# Patient Record
Sex: Female | Born: 1965 | Race: White | Hispanic: No | Marital: Married | Smoking: Never smoker
Health system: Southern US, Community
[De-identification: ages and names within clinical notes are randomized; demographics above are authoritative.]

## PROBLEM LIST (undated history)

## (undated) DIAGNOSIS — N200 Calculus of kidney: Secondary | ICD-10-CM

## (undated) DIAGNOSIS — M51369 Other intervertebral disc degeneration, lumbar region without mention of lumbar back pain or lower extremity pain: Secondary | ICD-10-CM

## (undated) DIAGNOSIS — M5030 Other cervical disc degeneration, unspecified cervical region: Secondary | ICD-10-CM

## (undated) DIAGNOSIS — G4489 Other headache syndrome: Secondary | ICD-10-CM

## (undated) DIAGNOSIS — E039 Hypothyroidism, unspecified: Secondary | ICD-10-CM

## (undated) DIAGNOSIS — M5136 Other intervertebral disc degeneration, lumbar region: Secondary | ICD-10-CM

## (undated) HISTORY — PX: BREAST ENHANCEMENT SURGERY: SHX7

## (undated) HISTORY — DX: Other headache syndrome: G44.89

## (undated) HISTORY — PX: NECK SURGERY: SHX720

## (undated) HISTORY — PX: BLOOD PATCH: SHX1245

## (undated) HISTORY — DX: Hypothyroidism, unspecified: E03.9

## (undated) HISTORY — PX: TUBAL LIGATION: SHX77

---

## 2001-06-21 ENCOUNTER — Emergency Department (HOSPITAL_COMMUNITY): Admission: EM | Admit: 2001-06-21 | Discharge: 2001-06-21 | Payer: Self-pay | Admitting: Internal Medicine

## 2001-06-21 ENCOUNTER — Encounter: Payer: Self-pay | Admitting: Internal Medicine

## 2001-07-02 ENCOUNTER — Ambulatory Visit (HOSPITAL_COMMUNITY): Admission: RE | Admit: 2001-07-02 | Discharge: 2001-07-02 | Payer: Self-pay | Admitting: Pulmonary Disease

## 2001-09-06 ENCOUNTER — Encounter: Admission: RE | Admit: 2001-09-06 | Discharge: 2001-09-13 | Payer: Self-pay | Admitting: Family Medicine

## 2001-09-20 ENCOUNTER — Encounter (HOSPITAL_COMMUNITY): Admission: RE | Admit: 2001-09-20 | Discharge: 2001-10-20 | Payer: Self-pay | Admitting: Pulmonary Disease

## 2001-11-08 ENCOUNTER — Encounter: Payer: Self-pay | Admitting: Neurosurgery

## 2001-11-08 ENCOUNTER — Ambulatory Visit (HOSPITAL_COMMUNITY): Admission: RE | Admit: 2001-11-08 | Discharge: 2001-11-08 | Payer: Self-pay | Admitting: Neurosurgery

## 2005-07-01 ENCOUNTER — Ambulatory Visit (HOSPITAL_COMMUNITY): Admission: RE | Admit: 2005-07-01 | Discharge: 2005-07-01 | Payer: Self-pay | Admitting: Plastic Surgery

## 2008-02-13 ENCOUNTER — Ambulatory Visit (HOSPITAL_COMMUNITY): Admission: RE | Admit: 2008-02-13 | Discharge: 2008-02-13 | Payer: Self-pay | Admitting: Pulmonary Disease

## 2014-02-24 ENCOUNTER — Ambulatory Visit (HOSPITAL_COMMUNITY)
Admission: RE | Admit: 2014-02-24 | Discharge: 2014-02-24 | Disposition: A | Discharge status: Home / Self Care | Payer: Managed Care, Other (non HMO) | Source: Ambulatory Visit | Attending: Family Medicine | Admitting: Family Medicine

## 2014-02-24 ENCOUNTER — Other Ambulatory Visit (HOSPITAL_COMMUNITY): Payer: Self-pay | Admitting: Family Medicine

## 2014-02-24 DIAGNOSIS — R1031 Right lower quadrant pain: Secondary | ICD-10-CM

## 2014-02-24 DIAGNOSIS — N200 Calculus of kidney: Secondary | ICD-10-CM | POA: Insufficient documentation

## 2014-02-24 MED ORDER — IOHEXOL 300 MG/ML  SOLN
100.0000 mL | Freq: Once | INTRAMUSCULAR | Status: AC | PRN
  Administered 2014-02-24: 100 mL via INTRAVENOUS

## 2014-02-24 MED ORDER — SODIUM CHLORIDE 0.9 % IJ SOLN
INTRAMUSCULAR | Status: AC
  Filled 2014-02-24: qty 45

## 2014-02-24 MED ORDER — SODIUM CHLORIDE 0.9 % IJ SOLN
INTRAMUSCULAR | Status: AC
  Filled 2014-02-24: qty 500

## 2014-08-25 ENCOUNTER — Ambulatory Visit (HOSPITAL_COMMUNITY)
Admission: RE | Admit: 2014-08-25 | Discharge: 2014-08-25 | Disposition: A | Discharge status: Home / Self Care | Payer: Managed Care, Other (non HMO) | Source: Ambulatory Visit | Attending: Family Medicine | Admitting: Family Medicine

## 2014-08-25 ENCOUNTER — Other Ambulatory Visit (HOSPITAL_COMMUNITY): Payer: Self-pay | Admitting: Family Medicine

## 2014-08-25 DIAGNOSIS — R079 Chest pain, unspecified: Secondary | ICD-10-CM | POA: Diagnosis not present

## 2014-08-25 DIAGNOSIS — R918 Other nonspecific abnormal finding of lung field: Secondary | ICD-10-CM | POA: Diagnosis not present

## 2014-08-25 DIAGNOSIS — J069 Acute upper respiratory infection, unspecified: Secondary | ICD-10-CM

## 2014-08-25 DIAGNOSIS — R0602 Shortness of breath: Secondary | ICD-10-CM | POA: Diagnosis not present

## 2014-08-25 DIAGNOSIS — R05 Cough: Secondary | ICD-10-CM | POA: Insufficient documentation

## 2014-09-08 ENCOUNTER — Ambulatory Visit (HOSPITAL_COMMUNITY)
Admission: RE | Admit: 2014-09-08 | Discharge: 2014-09-08 | Disposition: A | Discharge status: Home / Self Care | Payer: Managed Care, Other (non HMO) | Source: Ambulatory Visit | Attending: Family Medicine | Admitting: Family Medicine

## 2014-09-08 ENCOUNTER — Other Ambulatory Visit (HOSPITAL_COMMUNITY): Payer: Self-pay | Admitting: Family Medicine

## 2014-09-08 DIAGNOSIS — Z87891 Personal history of nicotine dependence: Secondary | ICD-10-CM | POA: Insufficient documentation

## 2014-09-08 DIAGNOSIS — R0602 Shortness of breath: Secondary | ICD-10-CM | POA: Insufficient documentation

## 2014-09-08 DIAGNOSIS — J449 Chronic obstructive pulmonary disease, unspecified: Secondary | ICD-10-CM | POA: Diagnosis not present

## 2014-09-08 DIAGNOSIS — J069 Acute upper respiratory infection, unspecified: Secondary | ICD-10-CM

## 2014-09-08 DIAGNOSIS — R05 Cough: Secondary | ICD-10-CM | POA: Insufficient documentation

## 2014-10-22 ENCOUNTER — Telehealth: Payer: Self-pay

## 2014-10-22 NOTE — Telephone Encounter (Signed)
PATIENT RECEIVED LETTER TO SCHEDULE COLONOSCOPY   PLEASE CALL AT [phone] AFTER 4

## 2014-10-29 ENCOUNTER — Telehealth: Payer: Self-pay

## 2014-10-29 NOTE — Telephone Encounter (Signed)
See note of 10/29/2014.

## 2014-10-29 NOTE — Telephone Encounter (Signed)
Pt called to schedule colonoscopy. However, she has a spot on her left buttocks and she has pressure there sometimes and the pain will shoot to the top of her head and cause very bad pain.  She also has one mixed drink or a glass of wine daily.  Scheduled for OV with Laban Emperor, NP on 11/19/2014 at 9:00 AM.

## 2014-11-03 ENCOUNTER — Ambulatory Visit (INDEPENDENT_AMBULATORY_CARE_PROVIDER_SITE_OTHER): Payer: Managed Care, Other (non HMO) | Admitting: Gastroenterology

## 2014-11-03 ENCOUNTER — Encounter: Payer: Self-pay | Admitting: Gastroenterology

## 2014-11-03 ENCOUNTER — Other Ambulatory Visit: Payer: Self-pay

## 2014-11-03 VITALS — BP 107/65 | HR 63 | Temp 97.1°F | Ht 67.0 in | Wt 174.0 lb

## 2014-11-03 DIAGNOSIS — M533 Sacrococcygeal disorders, not elsewhere classified: Secondary | ICD-10-CM

## 2014-11-03 DIAGNOSIS — Z1211 Encounter for screening for malignant neoplasm of colon: Secondary | ICD-10-CM | POA: Diagnosis not present

## 2014-11-03 MED ORDER — PEG-KCL-NACL-NASULF-NA ASC-C 100 G PO SOLR: 1.0000 | ORAL | Status: DC

## 2014-11-03 NOTE — Patient Instructions (Signed)
I have ordered an xray of your tailbone.  You have been scheduled for a colonoscopy with Dr. Oneida Alar in the near future.   Further recommendations to follow.

## 2014-11-03 NOTE — Progress Notes (Signed)
Primary Care Physician:  Jana Half Primary Gastroenterologist:  Dr. Oneida Alar   Chief Complaint  Patient presents with  . Rectal Pain  . pain in buttocks    HPI:   Anna Orr is a 49 y.o. female presenting today at the request of Delman Cheadle, PA-C. Has sharp, shooting pains at coccyx. Onset in December. Unsure what sets off the pain. States while doing sit ups would have pain therefater. Quit exercising for a few days and didn't hurt, then restarted and it did. Jan had pneumonia but still had pain. Sometimes will last a few minutes, sometimes an hour or so. Sometimes all night long. Last week started around 6pm, lasted during the night, woke up the next day with pain still. No problems since Thursday. Was so bad Thursday unable to go to work. Currently just aching. No changes in bowel habits. No constipation or diarrhea. No rectal bleeding. Hx of hemorrhoids, 3 weeks ago. Shoots pressure to top of head every time she has the pain. Feels like her ears are stopped up when it happens. Has ache in right buttock with pain. No unexplained weight loss.   Past Medical History  Diagnosis Date  . Hypothyroidism     Past Surgical History  Procedure Laterality Date  . Tubal ligation    . Breast enhancement surgery      Current Outpatient Prescriptions  Medication Sig Dispense Refill  . estrogen, conjugated,-medroxyprogesterone (PREMPRO) 0.625-2.5 MG per tablet Take 1 tablet by mouth daily.    Marland Kitchen LEVOTHYROXINE SODIUM PO Take 0.05 mg by mouth daily.     No current facility-administered medications for this visit.    Allergies as of 11/03/2014  . (No Known Allergies)    Family History  Problem Relation Age of Onset  . Colon cancer Neg Hx     History   Social History  . Marital Status: Married    Spouse Name: N/A  . Number of Children: N/A  . Years of Education: N/A   Occupational History  . printing     lifting, pushing   Social History Main Topics    . Smoking status: Never Smoker   . Smokeless tobacco: Not on file  . Alcohol Use: 0.0 oz/week    0 Standard drinks or equivalent per week     Comment: 1 mixed drink a day   . Drug Use: No  . Sexual Activity: Not on file   Other Topics Concern  . Not on file   Social History Narrative  . No narrative on file    Review of Systems: Negative unless mentioned in HPI.   Physical Exam: BP 107/65 mmHg  Pulse 63  Temp(Src) 97.1 F (36.2 C)  Ht 5\' 7"  (1.702 m)  Wt 174 lb (78.926 kg)  BMI 27.25 kg/m2 General:   Alert and oriented. Pleasant and cooperative. Well-nourished and well-developed.  Head:  Normocephalic and atraumatic. Eyes:  Without icterus, sclera clear and conjunctiva pink.  Ears:  Normal auditory acuity. Nose:  No deformity, discharge,  or lesions. Mouth:  No deformity or lesions, oral mucosa pink.  Lungs:  Clear to auscultation bilaterally. No wheezes, rales, or rhonchi. No distress.  Heart:  S1, S2 present without murmurs appreciated.  Abdomen:  +BS, soft, non-tender and non-distended. No HSM noted. No guarding or rebound. No masses appreciated.  Rectal:  Deferred  Msk:  Symmetrical without gross deformities. Normal posture. Point tenderness at coccyx Extremities:  Without clubbing or edema. Neurologic:  Alert and  oriented x4;  grossly normal neurologically. Skin:  Intact without significant lesions or rashes. Psych:  Alert and cooperative. Normal mood and affect.

## 2014-11-04 ENCOUNTER — Inpatient Hospital Stay (HOSPITAL_COMMUNITY): Admission: RE | Admit: 2014-11-04 | Payer: Managed Care, Other (non HMO) | Source: Ambulatory Visit

## 2014-11-04 ENCOUNTER — Ambulatory Visit (HOSPITAL_COMMUNITY)
Admission: RE | Admit: 2014-11-04 | Discharge: 2014-11-04 | Disposition: A | Discharge status: Home / Self Care | Payer: Managed Care, Other (non HMO) | Source: Ambulatory Visit | Attending: Gastroenterology | Admitting: Gastroenterology

## 2014-11-04 DIAGNOSIS — M533 Sacrococcygeal disorders, not elsewhere classified: Secondary | ICD-10-CM | POA: Diagnosis present

## 2014-11-07 ENCOUNTER — Encounter (HOSPITAL_COMMUNITY): Payer: Self-pay | Admitting: *Deleted

## 2014-11-07 ENCOUNTER — Encounter (HOSPITAL_COMMUNITY)
Admission: RE | Disposition: A | Discharge status: Home / Self Care | Payer: Self-pay | Source: Ambulatory Visit | Attending: Gastroenterology

## 2014-11-07 ENCOUNTER — Ambulatory Visit (HOSPITAL_COMMUNITY)
Admission: RE | Admit: 2014-11-07 | Discharge: 2014-11-07 | Disposition: A | Discharge status: Home / Self Care | Payer: Managed Care, Other (non HMO) | Source: Ambulatory Visit | Attending: Gastroenterology | Admitting: Gastroenterology

## 2014-11-07 DIAGNOSIS — Z9851 Tubal ligation status: Secondary | ICD-10-CM | POA: Diagnosis not present

## 2014-11-07 DIAGNOSIS — E039 Hypothyroidism, unspecified: Secondary | ICD-10-CM | POA: Diagnosis not present

## 2014-11-07 DIAGNOSIS — D128 Benign neoplasm of rectum: Secondary | ICD-10-CM

## 2014-11-07 DIAGNOSIS — K621 Rectal polyp: Secondary | ICD-10-CM | POA: Insufficient documentation

## 2014-11-07 DIAGNOSIS — K644 Residual hemorrhoidal skin tags: Secondary | ICD-10-CM | POA: Diagnosis not present

## 2014-11-07 DIAGNOSIS — Z79899 Other long term (current) drug therapy: Secondary | ICD-10-CM | POA: Diagnosis not present

## 2014-11-07 DIAGNOSIS — K6289 Other specified diseases of anus and rectum: Secondary | ICD-10-CM | POA: Diagnosis present

## 2014-11-07 DIAGNOSIS — Z1211 Encounter for screening for malignant neoplasm of colon: Secondary | ICD-10-CM

## 2014-11-07 HISTORY — PX: COLONOSCOPY: SHX5424

## 2014-11-07 SURGERY — COLONOSCOPY
Anesthesia: Moderate Sedation

## 2014-11-07 MED ORDER — STERILE WATER FOR IRRIGATION IR SOLN
Status: DC | PRN
  Administered 2014-11-07: 09:00:00

## 2014-11-07 MED ORDER — PROMETHAZINE HCL 25 MG/ML IJ SOLN
INTRAMUSCULAR | Status: AC
  Filled 2014-11-07: qty 1

## 2014-11-07 MED ORDER — MIDAZOLAM HCL 5 MG/5ML IJ SOLN
INTRAMUSCULAR | Status: AC
  Filled 2014-11-07: qty 10

## 2014-11-07 MED ORDER — SODIUM CHLORIDE 0.9 % IV SOLN
INTRAVENOUS | Status: DC
  Administered 2014-11-07: 08:00:00 via INTRAVENOUS

## 2014-11-07 MED ORDER — PROMETHAZINE HCL 25 MG/ML IJ SOLN
25.0000 mg | Freq: Once | INTRAMUSCULAR | Status: AC
  Administered 2014-11-07: 25 mg via INTRAVENOUS

## 2014-11-07 MED ORDER — MIDAZOLAM HCL 5 MG/5ML IJ SOLN
INTRAMUSCULAR | Status: DC | PRN
  Administered 2014-11-07: 1 mg via INTRAVENOUS
  Administered 2014-11-07 (×2): 2 mg via INTRAVENOUS
  Administered 2014-11-07: 1 mg via INTRAVENOUS

## 2014-11-07 MED ORDER — MEPERIDINE HCL 100 MG/ML IJ SOLN
INTRAMUSCULAR | Status: DC | PRN
  Administered 2014-11-07 (×2): 25 mg via INTRAVENOUS
  Administered 2014-11-07: 50 mg via INTRAVENOUS

## 2014-11-07 MED ORDER — HYDROCORTISONE ACE-PRAMOXINE 1-1 % RE CREA: TOPICAL_CREAM | RECTAL | Status: DC

## 2014-11-07 MED ORDER — SODIUM CHLORIDE 0.9 % IJ SOLN
INTRAMUSCULAR | Status: AC
  Filled 2014-11-07: qty 3

## 2014-11-07 MED ORDER — MEPERIDINE HCL 100 MG/ML IJ SOLN
INTRAMUSCULAR | Status: AC
  Filled 2014-11-07: qty 2

## 2014-11-07 NOTE — Progress Notes (Signed)
REVIEWED-NO ADDITIONAL RECOMMENDATIONS. 

## 2014-11-07 NOTE — H&P (Signed)
  Primary Care Physician:  Jana Half Primary Gastroenterologist:  Dr. Oneida Alar  Pre-Procedure History & Physical: HPI:  Anna Orr is a 49 y.o. female here for [place].  Past Medical History  Diagnosis Date  . Hypothyroidism     Past Surgical History  Procedure Laterality Date  . Tubal ligation    . Breast enhancement surgery      Prior to Admission medications   Medication Sig Start Date End Date Taking? Authorizing Provider  estrogen, conjugated,-medroxyprogesterone (PREMPRO) 0.625-2.5 MG per tablet Take 1 tablet by mouth daily.   Yes Historical Provider, MD  levothyroxine (SYNTHROID, LEVOTHROID) 50 MCG tablet Take 50 mcg by mouth daily before breakfast.   Yes Historical Provider, MD  peg 3350 powder (MOVIPREP) 100 G SOLR Take 1 kit (200 g total) by mouth as directed. 11/03/14  Yes Danie Binder, MD    Allergies as of 11/03/2014  . (No Known Allergies)    Family History  Problem Relation Age of Onset  . Colon cancer Neg Hx     History   Social History  . Marital Status: Married    Spouse Name: N/A  . Number of Children: N/A  . Years of Education: N/A   Occupational History  . printing     lifting, pushing   Social History Main Topics  . Smoking status: Never Smoker   . Smokeless tobacco: Not on file  . Alcohol Use: 0.0 oz/week    0 Standard drinks or equivalent per week     Comment: 1 mixed drink a day   . Drug Use: No  . Sexual Activity: Not on file   Other Topics Concern  . Not on file   Social History Narrative    Review of Systems: See HPI, otherwise negative ROS   Physical Exam: BP 116/63 mmHg  Pulse 65  Temp(Src) 97.6 F (36.4 C) (Oral)  Resp 20  Ht 5' 7" (1.702 m)  Wt 174 lb (78.926 kg)  BMI 27.25 kg/m2  SpO2 96% General:   Alert,  pleasant and cooperative in NAD Head:  Normocephalic and atraumatic. Neck:  Supple; Lungs:  Clear throughout to auscultation.    Heart:  Regular rate and rhythm. Abdomen:   Soft, nontender and nondistended. Normal bowel sounds, without guarding, and without rebound.   Neurologic:  Alert and  oriented x4;  grossly normal neurologically.  Impression/Plan:    SCREENING  Plan:  1. TCS TODAY

## 2014-11-07 NOTE — Discharge Instructions (Signed)
You had 2 polyps removed FROM YOUR RECTUM. You have LARGE EXTERNAL hemorrhoids.   DRINK WATER TO KEEP YOUR URINE LIGHT YELLOW.  FOLLOW A HIGH FIBER DIET. AVOID ITEMS THAT CAUSE BLOATING & GAS. SEE INFO BELOW.  USE PROCTO-CREAM FOUR TIMES A DAY FOR 10 DAYS. CALL THE OFFICE IF YOU ARE NOT BETTER IN 3 WEEKS.   YOUR BIOPSY RESULTS WILL BE AVAILABLE IN MY CHART AFTER APR 5 AND MY OFFICE WILL CONTACT YOU IN 10-14 DAYS WITH YOUR RESULTS.   FOLLOW UP IN 3 MOS.   Next colonoscopy in 5-10 years.   Colonoscopy Care After Read the instructions outlined below and refer to this sheet in the next week. These discharge instructions provide you with general information on caring for yourself after you leave the hospital. While your treatment has been planned according to the most current medical practices available, unavoidable complications occasionally occur. If you have any problems or questions after discharge, call DR. Zong Mcquarrie, [phone].  ACTIVITY  You may resume your regular activity, but move at a slower pace for the next 24 hours.   Take frequent rest periods for the next 24 hours.   Walking will help get rid of the air and reduce the bloated feeling in your belly (abdomen).   No driving for 24 hours (because of the medicine (anesthesia) used during the test).   You may shower.   Do not sign any important legal documents or operate any machinery for 24 hours (because of the anesthesia used during the test).    NUTRITION  Drink plenty of fluids.   You may resume your normal diet as instructed by your doctor.   Begin with a light meal and progress to your normal diet. Heavy or fried foods are harder to digest and may make you feel sick to your stomach (nauseated).   Avoid alcoholic beverages for 24 hours or as instructed.    MEDICATIONS  You may resume your normal medications.   WHAT YOU CAN EXPECT TODAY  Some feelings of bloating in the abdomen.   Passage of more gas  than usual.   Spotting of blood in your stool or on the toilet paper  .  IF YOU HAD POLYPS REMOVED DURING THE COLONOSCOPY:  Eat a soft diet IF YOU HAVE NAUSEA, BLOATING, ABDOMINAL PAIN, OR VOMITING.    FINDING OUT THE RESULTS OF YOUR TEST Not all test results are available during your visit. DR. Oneida Alar WILL CALL YOU WITHIN 14 DAYS OF YOUR PROCEDUE WITH YOUR RESULTS. Do not assume everything is normal if you have not heard from DR. Dayna Geurts, CALL HER OFFICE AT [phone].  SEEK IMMEDIATE MEDICAL ATTENTION AND CALL THE OFFICE: [phone] IF:  You have more than a spotting of blood in your stool.   Your belly is swollen (abdominal distention).   You are nauseated or vomiting.   You have a temperature over 101F.   You have abdominal pain or discomfort that is severe or gets worse throughout the day.   High-Fiber Diet A high-fiber diet changes your normal diet to include more whole grains, legumes, fruits, and vegetables. Changes in the diet involve replacing refined carbohydrates with unrefined foods. The calorie level of the diet is essentially unchanged. The Dietary Reference Intake (recommended amount) for adult males is 38 grams per day. For adult females, it is 25 grams per day. Pregnant and lactating women should consume 28 grams of fiber per day. Fiber is the intact part of a plant that is not  broken down during digestion. Functional fiber is fiber that has been isolated from the plant to provide a beneficial effect in the body. PURPOSE  Increase stool bulk.   Ease and regulate bowel movements.   Lower cholesterol.  INDICATIONS THAT YOU NEED MORE FIBER  Constipation and hemorrhoids.   Uncomplicated diverticulosis (intestine condition) and irritable bowel syndrome.   Weight management.   As a protective measure against hardening of the arteries (atherosclerosis), diabetes, and cancer.   GUIDELINES FOR INCREASING FIBER IN THE DIET  Start adding fiber to the diet  slowly. A gradual increase of about 5 more grams (2 slices of whole-wheat bread, 2 servings of most fruits or vegetables, or 1 bowl of high-fiber cereal) per day is best. Too rapid an increase in fiber may result in constipation, flatulence, and bloating.   Drink enough water and fluids to keep your urine clear or pale yellow. Water, juice, or caffeine-free drinks are recommended. Not drinking enough fluid may cause constipation.   Eat a variety of high-fiber foods rather than one type of fiber.   Try to increase your intake of fiber through using high-fiber foods rather than fiber pills or supplements that contain small amounts of fiber.   The goal is to change the types of food eaten. Do not supplement your present diet with high-fiber foods, but replace foods in your present diet.  INCLUDE A VARIETY OF FIBER SOURCES  Replace refined and processed grains with whole grains, canned fruits with fresh fruits, and incorporate other fiber sources. White rice, white breads, and most bakery goods contain little or no fiber.   Brown whole-grain rice, buckwheat oats, and many fruits and vegetables are all good sources of fiber. These include: broccoli, Brussels sprouts, cabbage, cauliflower, beets, sweet potatoes, white potatoes (skin on), carrots, tomatoes, eggplant, squash, berries, fresh fruits, and dried fruits.   Cereals appear to be the richest source of fiber. Cereal fiber is found in whole grains and bran. Bran is the fiber-rich outer coat of cereal grain, which is largely removed in refining. In whole-grain cereals, the bran remains. In breakfast cereals, the largest amount of fiber is found in those with "bran" in their names. The fiber content is sometimes indicated on the label.   You may need to include additional fruits and vegetables each day.   In baking, for 1 cup white flour, you may use the following substitutions:   1 cup whole-wheat flour minus 2 tablespoons.   1/2 cup white flour  plus 1/2 cup whole-wheat flour.   Polyps, Colon  A polyp is extra tissue that grows inside your body. Colon polyps grow in the large intestine. The large intestine, also called the colon, is part of your digestive system. It is a long, hollow tube at the end of your digestive tract where your body makes and stores stool. Most polyps are not dangerous. They are benign. This means they are not cancerous. But over time, some types of polyps can turn into cancer. Polyps that are smaller than a pea are usually not harmful. But larger polyps could someday become or may already be cancerous. To be safe, doctors remove all polyps and test them.   WHO GETS POLYPS? Anyone can get polyps, but certain people are more likely than others. You may have a greater chance of getting polyps if:  You are over 50.   You have had polyps before.   Someone in your family has had polyps.   Someone in your family has  had cancer of the large intestine.   Find out if someone in your family has had polyps. You may also be more likely to get polyps if you:   Eat a lot of fatty foods   Smoke   Drink alcohol   Do not exercise  Eat too much   TREATMENT  The caregiver will remove the polyp during sigmoidoscopy or colonoscopy.    PREVENTION There is not one sure way to prevent polyps. You might be able to lower your risk of getting them if you:  Eat more fruits and vegetables and less fatty food.   Do not smoke.   Avoid alcohol.   Exercise every day.   Lose weight if you are overweight.   Eating more calcium and folate can also lower your risk of getting polyps. Some foods that are rich in calcium are milk, cheese, and broccoli. Some foods that are rich in folate are chickpeas, kidney beans, and spinach.   Hemorrhoids Hemorrhoids are dilated (enlarged) veins around the rectum. Sometimes clots will form in the veins. This makes them swollen and painful. These are called thrombosed hemorrhoids. Causes of  hemorrhoids include:  Constipation.   Straining to have a bowel movement.   HEAVY LIFTING HOME CARE INSTRUCTIONS  Eat a well balanced diet and drink 6 to 8 glasses of water every day to avoid constipation. You may also use a bulk laxative.   Avoid straining to have bowel movements.   Keep anal area dry and clean.   Do not use a donut shaped pillow or sit on the toilet for long periods. This increases blood pooling and pain.   Move your bowels when your body has the urge; this will require less straining and will decrease pain and pressure.

## 2014-11-07 NOTE — Op Note (Signed)
[facility] [address] [place], [address]   COLONOSCOPY PROCEDURE REPORT  PATIENT: Anna, Orr  MR#: [id number] BIRTHDATE: [birth year] , 48  yrs. old GENDER: female ENDOSCOPIST: Danie Binder, MD REFERRED [id number] Glennon Mac, P.A. PROCEDURE DATE:  2014/11/11 PROCEDURE:   Colonoscopy with snare polypectomy INDICATIONS:average risk patient for colon cancer. pt having rectl pain cuasing pressure into her head. WORKS A PHYSICALLY DEMANDING JOB. MEDICATIONS: Demerol 100 mg IV and Versed 6 mg IV  DESCRIPTION OF PROCEDURE:    Physical exam was performed.  Informed consent was obtained from the patient after explaining the benefits, risks, and alternatives to procedure.  The patient was connected to monitor and placed in left lateral position. Continuous oxygen was provided by nasal cannula and IV medicine administered through an indwelling cannula.  After administration of sedation and rectal exam, the patients rectum was intubated and the EC-3890Li ([id number])  colonoscope was advanced under direct visualization to the ileum.  The scope was removed slowly by carefully examining the color, texture, anatomy, and integrity mucosa on the way out.  The patient was recovered in endoscopy and discharged home in satisfactory condition.    COLON FINDINGS: The examined terminal ileum appeared to be normal. , Two sessile polyps ranging from 5 to 66mm in size were found in the rectum.  A polypectomy was performed with SNARE CAUTERY  , The colon was redundant.  Manual abdominal counter-pressure was used to reach the cecum, and Moderate sized external hemorrhoids were found.  PREP QUALITY: good.  CECAL W/D TIME: 23       minutes COMPLICATIONS: None  ENDOSCOPIC IMPRESSION: 1.   RECTAL PAIN MOST LIKELY DUE TO Moderate sized external hemorrhoids 2.   Two RECTAL polyps REMOVED 3.   The LEFT colon IS redundant  RECOMMENDATIONS: DRINK WATER. FOLLOW A HIGH FIBER  DIET. MINIMIZE HEAVY LIFTING. USE HYDROCORTISONE CREAM FOUR TIMES A DAY FOR 10 DAYS.  CALL THE OFFICE IF SYMPTOMS ARE NOT BETTER IN 3 WEEKS. AWAIT BIOPSY. FOLLOW UP IN 3 MOS. Next colonoscopy in 5-10 years.  Consider overtube.    _______________________________ eSignedDanie Binder, MD 11/11/2014 7:27 PM   CPT CODES: ICD CODES:  The ICD and CPT codes recommended by this software are interpretations from the data that the clinical staff has captured with the software.  The verification of the translation of this report to the ICD and CPT codes and modifiers is the sole responsibility of the health care institution and practicing physician where this report was generated.  [employer] will not be held responsible for the validity of the ICD and CPT codes included on this report.  AMA assumes no liability for data contained or not contained herein. CPT is a Designer, television/film set of the [employer].

## 2014-11-09 DIAGNOSIS — M533 Sacrococcygeal disorders, not elsewhere classified: Secondary | ICD-10-CM | POA: Insufficient documentation

## 2014-11-09 NOTE — Assessment & Plan Note (Signed)
Due for routine screening colonoscopy now. No changes in bowel habits, rectal bleeding. Does note coccyx discomfort that seems to be correlated with movement. Will proceed with xray for further evaluation.  Proceed with colonoscopy with Dr. Oneida Alar in the near future. The risks, benefits, and alternatives have been discussed in detail with the patient. They state understanding and desire to proceed.

## 2014-11-10 ENCOUNTER — Telehealth: Payer: Self-pay | Admitting: Gastroenterology

## 2014-11-10 ENCOUNTER — Encounter (HOSPITAL_COMMUNITY): Payer: Self-pay | Admitting: Gastroenterology

## 2014-11-10 NOTE — Telephone Encounter (Signed)
I have not received anything from Dr. Oneida Alar. I just now paged her since pt's husband is waiting here for the note.  Per Marzetta Board, pt's husband is aware I have to find out from Dr. Oneida Alar so he is leaving and pt will come by on her way to work today before 4:00 pm.

## 2014-11-10 NOTE — Telephone Encounter (Signed)
I DID NOT RECEIVE A PAGE. PT NEEDS A NOTE TO SAY: PT NEEDS ASSISTANCE WITH HEAVY LIFTING.  NO  LIFTING(> 10 LBS) UNTIL APR 22.

## 2014-11-10 NOTE — Telephone Encounter (Signed)
Pt called to say that SF told her to stop by the office for a work note and she is sending her husband on his lunch break to come pick it up. Please advise if you have gotten anything from [facility] about this.

## 2014-11-10 NOTE — Progress Notes (Signed)
cc'ed to pcp °

## 2014-11-10 NOTE — Telephone Encounter (Signed)
Note done and put up front for her to pick up

## 2014-11-17 ENCOUNTER — Other Ambulatory Visit: Payer: Self-pay

## 2014-11-17 ENCOUNTER — Telehealth: Payer: Self-pay | Admitting: Gastroenterology

## 2014-11-17 MED ORDER — HYDROCORTISONE ACE-PRAMOXINE 1-1 % RE CREA: TOPICAL_CREAM | RECTAL | Status: DC

## 2014-11-17 NOTE — Telephone Encounter (Signed)
Patient said that [facility] had called in a prescription (doesn't know name of med) to [facility] and her insurance will not cover it and it'll cost her $230 to get. She said something else had been called in and it's not helping her so good and now wants to know if she can get samples of the first prescription that she doesn't remember the name of. I told her without knowing the name of the medicine that I wouldn't know if we have samples and I would have the nurse call her and see what is recommended. [phone]

## 2014-11-17 NOTE — Progress Notes (Signed)
Quick Note:  Xray normal.  Colonoscopy completed.   ______

## 2014-11-17 NOTE — Telephone Encounter (Signed)
I called pt and she said she is having a lot of rectal pain. She thinks the hemorrhoid is on a nerve. The Anal Pram 2.5 % would cost $230.00  And she did not feel she could afford that.  The hydrocortisone cream is not really helping. She is in pain and had to miss work today.  I called the pharmacist, Tammy, at [facility] and she said pt should call her insurance co and find out what is on their formulary that they will cover.  I called pt back and informed her and she said she will call them.

## 2014-11-18 NOTE — Progress Notes (Signed)
Quick Note:  Just left message for a return call in reference to meds. ______

## 2014-11-18 NOTE — Telephone Encounter (Signed)
Yes

## 2014-11-18 NOTE — Telephone Encounter (Signed)
I called pt and LMOM for a return call to see if she contacted the insurance co about another medication.

## 2014-11-18 NOTE — Progress Notes (Signed)
Quick Note:  Pt called and was informed. ______ 

## 2014-11-18 NOTE — Telephone Encounter (Signed)
Pt called and said her husband went on line and got a 75% off coupon and they got it at [facility] instead of the previous pharmacy. She only had to pay a little over $50.00.   Pt did say that since she missed work yesterday, she will get a point against her. She is checking into FMLA and would like to know if we can do paperwork if she checks on it, so if she has a flare, she will not get penalized.   Please advise!

## 2014-11-18 NOTE — Telephone Encounter (Signed)
We could always try the [facility] cream. That is usually 50$ even without insurance I believe. If she would like that, please call it in to [facility] to apply BID for 2 weeks. Dispense 1 tube with 1 refill.

## 2014-11-19 ENCOUNTER — Ambulatory Visit: Payer: Managed Care, Other (non HMO) | Admitting: Gastroenterology

## 2014-11-19 NOTE — Telephone Encounter (Signed)
LMOM that Vicente Males Will do FMLA if needed.

## 2014-11-21 ENCOUNTER — Telehealth: Payer: Self-pay | Admitting: Gastroenterology

## 2014-11-21 NOTE — Telephone Encounter (Signed)
Please call pt. She had TWO simple adenomas removed.   DRINK WATER TO KEEP YOUR URINE LIGHT YELLOW.  FOLLOW A HIGH FIBER DIET. AVOID ITEMS THAT CAUSE BLOATING & GAS.   FOLLOW UP IN 3 MOS E30 RECTAL PAIN/HEMORRHOIDS.   Next colonoscopy in 5 years WITH AN OVERTUBE.

## 2014-11-21 NOTE — Telephone Encounter (Signed)
Called pt and LMOM.  

## 2014-11-24 NOTE — Telephone Encounter (Signed)
Pt called and was informed of results.   She said she has a note from Dr. Oneida Alar about her lifting on job til 11/28/2014. She wants to know if she is still having problems if she will extend that time. She said she is getting help on the job now, but when the note expires, she is afraid it will be the same way. When she tries to lift she still has a lot of pain. She wants to know if she needs to come in earlier than the 3 months to get the note renewed.  She does not have bleeding from the hemorrhoid, just the pain with lifting.  Please advise!

## 2014-11-24 NOTE — Telephone Encounter (Signed)
OV made and NICed for tcs in 5 years

## 2014-11-24 NOTE — Telephone Encounter (Signed)
PLEASE CALL PT. I CAN EXTEND THE NOTE (UNTIL MAY13)FOR AN ADDITIONAL 3 WEEKS. SE DOES NOT NEED AN APPT.

## 2014-11-24 NOTE — Telephone Encounter (Signed)
LMOM for the pt and told her I am mailed the note to her.

## 2014-11-24 NOTE — Telephone Encounter (Signed)
Letter mailed to pt to call for results.  

## 2015-01-08 ENCOUNTER — Other Ambulatory Visit: Payer: Self-pay

## 2015-01-08 ENCOUNTER — Telehealth: Payer: Self-pay

## 2015-01-08 DIAGNOSIS — K649 Unspecified hemorrhoids: Secondary | ICD-10-CM

## 2015-01-08 MED ORDER — HYDROCORTISONE ACE-PRAMOXINE 1-1 % RE CREA: TOPICAL_CREAM | RECTAL | Status: DC

## 2015-01-08 MED ORDER — LIDOCAINE-HYDROCORTISONE ACE 3-2.5 % RE KIT: PACK | RECTAL | Status: DC

## 2015-01-08 NOTE — Telephone Encounter (Signed)
Pt aware.

## 2015-01-08 NOTE — Addendum Note (Signed)
Addended by: Danie Binder on: 01/08/2015 01:44 PM   Modules accepted: Orders, Medications

## 2015-01-08 NOTE — Telephone Encounter (Signed)
Pt is aware. States she is willing to try cream before going to a surgeon right now.

## 2015-01-08 NOTE — Telephone Encounter (Signed)
PLEASE CALL PT. SHE MAY HAVE ANOTHER ROUND OF RECTAL CREAM FOR HER RECTAL PAIN. I SENT HER RX FOR [facility] FOR THEM TO COMPOUND A HEMORRHOID CREAM THAT IS BETTER AT RELIEVING RECTAL PAIN THAN PLAIN HYDROCORTISONE CREAM.   IF SHE IS HAVING THAT MUCH DIFFICULTY WITH RECTAL PAIN, SHE NEEDS TO SEE A SURGEON FOR SURGICAL MANAGEMENT OF HER HEMORRHOIDS. SHE SHOULD USE IBUPROFEN WITH FOOD OR MILK AND TYLENOL AS NEEDED FOR ADDITIONAL PAIN RELIEF.

## 2015-01-08 NOTE — Telephone Encounter (Signed)
PLEASE CALL PT. Rx sent for Procto-cream to [facility].

## 2015-01-08 NOTE — Telephone Encounter (Addendum)
Pt called back and states that the cream is going to be over $300.00 and she can't pay that. Wants to proceed with surgical referral.  Wants to know if there is any other ointment that may be cheaper. Sent referral to Dr. Arnoldo Morale

## 2015-01-08 NOTE — Telephone Encounter (Signed)
Pt called and states that she had a TCS in April. States that she is having rectal pain and flare ups.  States she only has 800mg  of Iburprofen and is wanting to know if she can have some pain medications

## 2015-02-06 ENCOUNTER — Ambulatory Visit: Payer: Managed Care, Other (non HMO) | Admitting: Gastroenterology

## 2015-02-25 ENCOUNTER — Other Ambulatory Visit (HOSPITAL_COMMUNITY): Payer: Self-pay | Admitting: Family Medicine

## 2015-02-25 DIAGNOSIS — M545 Low back pain: Secondary | ICD-10-CM

## 2015-02-25 DIAGNOSIS — M5410 Radiculopathy, site unspecified: Secondary | ICD-10-CM

## 2015-03-05 ENCOUNTER — Ambulatory Visit (HOSPITAL_COMMUNITY)
Admission: RE | Admit: 2015-03-05 | Discharge: 2015-03-05 | Disposition: A | Discharge status: Home / Self Care | Payer: Managed Care, Other (non HMO) | Source: Ambulatory Visit | Attending: Family Medicine | Admitting: Family Medicine

## 2015-03-05 DIAGNOSIS — G9619 Other disorders of meninges, not elsewhere classified: Secondary | ICD-10-CM | POA: Insufficient documentation

## 2015-03-05 DIAGNOSIS — M5410 Radiculopathy, site unspecified: Secondary | ICD-10-CM

## 2015-03-05 DIAGNOSIS — M5136 Other intervertebral disc degeneration, lumbar region: Secondary | ICD-10-CM | POA: Insufficient documentation

## 2015-03-05 DIAGNOSIS — M545 Low back pain: Secondary | ICD-10-CM

## 2015-03-05 DIAGNOSIS — M533 Sacrococcygeal disorders, not elsewhere classified: Secondary | ICD-10-CM | POA: Insufficient documentation

## 2015-03-27 ENCOUNTER — Other Ambulatory Visit (HOSPITAL_COMMUNITY): Payer: Self-pay | Admitting: Neurosurgery

## 2015-03-27 DIAGNOSIS — G960 Cerebrospinal fluid leak: Principal | ICD-10-CM

## 2015-03-27 DIAGNOSIS — G9601 Cranial cerebrospinal fluid leak, spontaneous: Secondary | ICD-10-CM

## 2015-04-02 ENCOUNTER — Other Ambulatory Visit: Payer: Self-pay | Admitting: Neurosurgery

## 2015-04-02 DIAGNOSIS — R51 Headache with orthostatic component, not elsewhere classified: Secondary | ICD-10-CM

## 2015-04-03 ENCOUNTER — Ambulatory Visit
Admission: RE | Admit: 2015-04-03 | Discharge: 2015-04-03 | Disposition: A | Discharge status: Home / Self Care | Payer: Managed Care, Other (non HMO) | Source: Ambulatory Visit | Attending: Neurosurgery | Admitting: Neurosurgery

## 2015-04-03 DIAGNOSIS — R51 Headache with orthostatic component, not elsewhere classified: Secondary | ICD-10-CM

## 2015-04-03 MED ORDER — IOHEXOL 180 MG/ML  SOLN
1.0000 mL | Freq: Once | INTRAMUSCULAR | Status: DC | PRN
  Administered 2015-04-03: 1 mL via EPIDURAL

## 2015-04-03 NOTE — Discharge Instructions (Signed)

## 2015-04-03 NOTE — Progress Notes (Signed)
20 cc's of blood drawn from right AC. Site is unremarkable and pt tolerated procedure well.

## 2015-04-06 ENCOUNTER — Ambulatory Visit (HOSPITAL_COMMUNITY): Payer: Managed Care, Other (non HMO)

## 2015-04-07 ENCOUNTER — Encounter: Payer: Self-pay | Admitting: Gastroenterology

## 2015-04-07 ENCOUNTER — Other Ambulatory Visit: Payer: Self-pay | Admitting: Neurosurgery

## 2015-04-07 ENCOUNTER — Telehealth: Payer: Self-pay

## 2015-04-07 DIAGNOSIS — G9600 Cerebrospinal fluid leak, unspecified: Secondary | ICD-10-CM

## 2015-04-07 NOTE — Telephone Encounter (Signed)
Patient's husband left [place] a message at 0711 this morning.  I returned call to patient this morning regarding the return of her positional headache along with a back ache.  Apparently, per her husband's message, she was fine after the Epidural Blood Patch we performed Friday, 04/03/15 through Sunday, 04/05/15.  Her symptoms began to return yesterday, 04/06/15, and the husband wanted to know about his wife having another EBP.  I spoke with the patient a moment ago and told her she needed to follow up with Dr. Annette Stable to discuss her symptome and further treatment options.  She stated an understanding of this.  Brita Romp, RN

## 2015-04-07 NOTE — Telephone Encounter (Signed)
Anna Orr, I called the pt. She said her husband was making the contact for her and he e mailed the wrong office. They were trying to reach a Dr. Annette Stable about her spine. They will contact him.

## 2015-04-09 ENCOUNTER — Ambulatory Visit
Admission: RE | Admit: 2015-04-09 | Discharge: 2015-04-09 | Disposition: A | Discharge status: Home / Self Care | Payer: Managed Care, Other (non HMO) | Source: Ambulatory Visit | Attending: Neurosurgery | Admitting: Neurosurgery

## 2015-04-09 DIAGNOSIS — G9600 Cerebrospinal fluid leak, unspecified: Secondary | ICD-10-CM

## 2015-04-09 MED ORDER — IOHEXOL 180 MG/ML  SOLN
1.0000 mL | Freq: Once | INTRAMUSCULAR | Status: DC | PRN
  Administered 2015-04-09: 1 mL via EPIDURAL

## 2015-04-09 NOTE — Discharge Instructions (Signed)

## 2015-04-09 NOTE — Progress Notes (Signed)
20cc blood drawn from left AC space for Blood Patch; site unremarkable.

## 2015-04-16 NOTE — Progress Notes (Signed)
Licet called to report a return of her symptoms (low back pain with a positional headache) last night at work after having two Epidural Blood Patches here (04/03/15 with Dr. Barbie Banner and 04/09/15 with Dr. Jobe Igo).  I assured her she did nothing wrong, as she was concerned about this.  She has a call in to Dr. Annette Stable, as well, and she and I discussed Dr. Nori Riis suggestion of performing a myelogram to identify exactly from where her spinal fluid was leaking so that appropriate treatment could be given.  Aqueelah will ask Dr. Annette Stable to order this myelogram.  She also mentioned Dr. Jobe Igo saying he knows a physician at [facility] who uses glue to seal off the type of leak he suspects she has, though the myelogram would need to be done prior to treatment.  Adrienne states an understanding of all this and wishes to proceed with further diagnostic studies ASAP in order to finally treat and stop these "awful headaches I keep getting.

## 2015-04-29 ENCOUNTER — Other Ambulatory Visit: Payer: Self-pay | Admitting: Neurosurgery

## 2015-04-29 DIAGNOSIS — G9600 Cerebrospinal fluid leak, unspecified: Secondary | ICD-10-CM

## 2015-04-30 ENCOUNTER — Telehealth: Payer: Self-pay | Admitting: Gastroenterology

## 2015-04-30 NOTE — Telephone Encounter (Signed)
FMLA papers on this patient is in [facility] office chair clipped to red folder

## 2015-05-06 ENCOUNTER — Ambulatory Visit
Admission: RE | Admit: 2015-05-06 | Discharge: 2015-05-06 | Disposition: A | Discharge status: Home / Self Care | Payer: 59 | Source: Ambulatory Visit | Attending: Neurosurgery | Admitting: Neurosurgery

## 2015-05-06 DIAGNOSIS — G9600 Cerebrospinal fluid leak, unspecified: Secondary | ICD-10-CM

## 2015-05-06 MED ORDER — ONDANSETRON HCL 4 MG/2ML IJ SOLN: 4.0000 mg | Freq: Four times a day (QID) | INTRAMUSCULAR | Status: DC | PRN

## 2015-05-06 MED ORDER — IOHEXOL 180 MG/ML  SOLN
15.0000 mL | Freq: Once | INTRAMUSCULAR | Status: DC | PRN
  Administered 2015-05-06: 15 mL via INTRATHECAL

## 2015-05-06 MED ORDER — DIAZEPAM 5 MG PO TABS
10.0000 mg | ORAL_TABLET | Freq: Once | ORAL | Status: AC
  Administered 2015-05-06: 5 mg via ORAL

## 2015-05-06 NOTE — Discharge Instructions (Signed)

## 2015-05-07 NOTE — Telephone Encounter (Signed)
PLEASE CALL PT. HER FMLA PAPERWORK IS COMPLETE. SHE NEEDS TO SEE A SURGEON TO HAVE HER HEMORRHOIDS FIXED.

## 2015-05-07 NOTE — Telephone Encounter (Signed)
Pt said to disregard the FMLA papers, they should have went to another physician. She said she does not need to see anyone for hemorrhoids, that she has no hemorrhoids.

## 2015-05-07 NOTE — Telephone Encounter (Signed)
SPOKE TO PATIENT AND SHE STATED THAT SHE HAD THE FMLA PAPERS SENT HERE BY [employer] AND THEY WERE MEANT TO GO TO ANOTHER PHYSICIAN.  TO DISREGARD THEM

## 2015-05-08 NOTE — Telephone Encounter (Signed)
REVIEWED-NO ADDITIONAL RECOMMENDATIONS. 

## 2015-05-12 ENCOUNTER — Other Ambulatory Visit: Payer: Self-pay | Admitting: Neurosurgery

## 2015-05-12 DIAGNOSIS — G971 Other reaction to spinal and lumbar puncture: Secondary | ICD-10-CM

## 2015-05-13 ENCOUNTER — Ambulatory Visit
Admission: RE | Admit: 2015-05-13 | Discharge: 2015-05-13 | Disposition: A | Discharge status: Home / Self Care | Payer: 59 | Source: Ambulatory Visit | Attending: Neurosurgery | Admitting: Neurosurgery

## 2015-05-13 ENCOUNTER — Telehealth: Payer: Self-pay | Admitting: Radiology

## 2015-05-13 DIAGNOSIS — G971 Other reaction to spinal and lumbar puncture: Secondary | ICD-10-CM

## 2015-05-13 MED ORDER — DIAZEPAM 5 MG PO TABS
5.0000 mg | ORAL_TABLET | Freq: Once | ORAL | Status: AC
  Administered 2015-05-13: 5 mg via ORAL

## 2015-05-13 MED ORDER — IOHEXOL 180 MG/ML  SOLN
1.0000 mL | Freq: Once | INTRAMUSCULAR | Status: DC | PRN
  Administered 2015-05-13: 1 mL via EPIDURAL

## 2015-05-13 NOTE — Discharge Instructions (Signed)

## 2015-05-13 NOTE — Telephone Encounter (Signed)
Pt had a blood patch this am. Her husband is calling because she has fullness in her head and her buttocks is hurting enough to make her cry. Explained these were all side effects of the blood patch and that they would go away in time. Also explained pt could take her pain meds or OTC pain meds for the discomfort and use ice to her back at the injection site. These are the same c/o pt had after her first blood patch several weeks ago.

## 2015-05-13 NOTE — Progress Notes (Signed)
Approximately 20cc blood drawn from right [facility] space for Epidural Blood Patch; site unremarkable.

## 2015-05-24 ENCOUNTER — Encounter (HOSPITAL_COMMUNITY): Payer: Self-pay | Admitting: Emergency Medicine

## 2015-05-24 ENCOUNTER — Emergency Department (HOSPITAL_COMMUNITY)
Admission: EM | Admit: 2015-05-24 | Discharge: 2015-05-24 | Disposition: A | Discharge status: Home / Self Care | Payer: Managed Care, Other (non HMO) | Attending: Emergency Medicine | Admitting: Emergency Medicine

## 2015-05-24 ENCOUNTER — Emergency Department (HOSPITAL_COMMUNITY): Payer: Managed Care, Other (non HMO)

## 2015-05-24 DIAGNOSIS — N23 Unspecified renal colic: Secondary | ICD-10-CM | POA: Diagnosis not present

## 2015-05-24 DIAGNOSIS — E039 Hypothyroidism, unspecified: Secondary | ICD-10-CM | POA: Diagnosis not present

## 2015-05-24 DIAGNOSIS — Z79899 Other long term (current) drug therapy: Secondary | ICD-10-CM | POA: Diagnosis not present

## 2015-05-24 DIAGNOSIS — R109 Unspecified abdominal pain: Secondary | ICD-10-CM

## 2015-05-24 DIAGNOSIS — Z8739 Personal history of other diseases of the musculoskeletal system and connective tissue: Secondary | ICD-10-CM | POA: Diagnosis not present

## 2015-05-24 DIAGNOSIS — N201 Calculus of ureter: Secondary | ICD-10-CM | POA: Diagnosis not present

## 2015-05-24 DIAGNOSIS — Z9851 Tubal ligation status: Secondary | ICD-10-CM | POA: Diagnosis not present

## 2015-05-24 HISTORY — DX: Calculus of kidney: N20.0

## 2015-05-24 HISTORY — DX: Other cervical disc degeneration, unspecified cervical region: M50.30

## 2015-05-24 HISTORY — DX: Other intervertebral disc degeneration, lumbar region without mention of lumbar back pain or lower extremity pain: M51.369

## 2015-05-24 HISTORY — DX: Other intervertebral disc degeneration, lumbar region: M51.36

## 2015-05-24 LAB — COMPREHENSIVE METABOLIC PANEL
ALT: 12 U/L — AB (ref 14–54)
AST: 13 U/L — AB (ref 15–41)
Albumin: 3.9 g/dL (ref 3.5–5.0)
Alkaline Phosphatase: 66 U/L (ref 38–126)
Anion gap: 7 (ref 5–15)
BUN: 17 mg/dL (ref 6–20)
CHLORIDE: 106 mmol/L (ref 101–111)
CO2: 26 mmol/L (ref 22–32)
CREATININE: 0.85 mg/dL (ref 0.44–1.00)
Calcium: 8.9 mg/dL (ref 8.9–10.3)
GFR calc Af Amer: >60 mL/min (ref >60)
Glucose, Bld: 120 mg/dL — ABNORMAL HIGH (ref 65–99)
POTASSIUM: 3.5 mmol/L (ref 3.5–5.1)
SODIUM: 139 mmol/L (ref 135–145)
Total Bilirubin: 0.6 mg/dL (ref 0.3–1.2)
Total Protein: 6.8 g/dL (ref 6.5–8.1)

## 2015-05-24 LAB — CBC WITH DIFFERENTIAL/PLATELET
BASOS ABS: 0 10*3/uL (ref 0.0–0.1)
Basophils Relative: 0 %
Eosinophils Absolute: 0.2 10*3/uL (ref 0.0–0.7)
Eosinophils Relative: 2 %
HEMATOCRIT: 38.5 % (ref 36.0–46.0)
Hemoglobin: 12.9 g/dL (ref 12.0–15.0)
LYMPHS ABS: 1.8 10*3/uL (ref 0.7–4.0)
LYMPHS PCT: 23 %
MCH: 30.6 pg (ref 26.0–34.0)
MCHC: 33.5 g/dL (ref 30.0–36.0)
MCV: 91.2 fL (ref 78.0–100.0)
MONO ABS: 0.5 10*3/uL (ref 0.1–1.0)
Monocytes Relative: 6 %
NEUTROS ABS: 5.4 10*3/uL (ref 1.7–7.7)
Neutrophils Relative %: 69 %
Platelets: 268 10*3/uL (ref 150–400)
RBC: 4.22 MIL/uL (ref 3.87–5.11)
RDW: 12.5 % (ref 11.5–15.5)
WBC: 7.9 10*3/uL (ref 4.0–10.5)

## 2015-05-24 LAB — URINALYSIS, ROUTINE W REFLEX MICROSCOPIC
Bilirubin Urine: NEGATIVE
GLUCOSE, UA: NEGATIVE mg/dL
KETONES UR: 15 mg/dL — AB
NITRITE: NEGATIVE
PROTEIN: 30 mg/dL — AB
Specific Gravity, Urine: 1.025 (ref 1.005–1.030)
UROBILINOGEN UA: 0.2 mg/dL (ref 0.0–1.0)
pH: 6.5 (ref 5.0–8.0)

## 2015-05-24 LAB — LIPASE, BLOOD: LIPASE: 24 U/L (ref 22–51)

## 2015-05-24 LAB — URINE MICROSCOPIC-ADD ON

## 2015-05-24 MED ORDER — FAMOTIDINE IN NACL 20-0.9 MG/50ML-% IV SOLN
20.0000 mg | Freq: Once | INTRAVENOUS | Status: AC
  Administered 2015-05-24: 20 mg via INTRAVENOUS
  Filled 2015-05-24: qty 50

## 2015-05-24 MED ORDER — ONDANSETRON HCL 4 MG PO TABS: 4.0000 mg | ORAL_TABLET | Freq: Three times a day (TID) | ORAL | Status: DC | PRN

## 2015-05-24 MED ORDER — TAMSULOSIN HCL 0.4 MG PO CAPS: 0.4000 mg | ORAL_CAPSULE | Freq: Every day | ORAL | Status: DC

## 2015-05-24 MED ORDER — FENTANYL CITRATE (PF) 100 MCG/2ML IJ SOLN
100.0000 ug | INTRAMUSCULAR | Status: DC | PRN
  Administered 2015-05-24: 100 ug via INTRAVENOUS
  Filled 2015-05-24: qty 2

## 2015-05-24 MED ORDER — SODIUM CHLORIDE 0.9 % IV SOLN
INTRAVENOUS | Status: DC
  Administered 2015-05-24: 1000 mL via INTRAVENOUS

## 2015-05-24 MED ORDER — MORPHINE SULFATE (PF) 4 MG/ML IV SOLN
4.0000 mg | INTRAVENOUS | Status: DC | PRN
  Administered 2015-05-24: 4 mg via INTRAVENOUS
  Filled 2015-05-24: qty 1

## 2015-05-24 MED ORDER — OXYCODONE-ACETAMINOPHEN 5-325 MG PO TABS: ORAL_TABLET | ORAL | Status: DC

## 2015-05-24 MED ORDER — ONDANSETRON HCL 4 MG/2ML IJ SOLN
4.0000 mg | INTRAMUSCULAR | Status: DC | PRN
  Administered 2015-05-24: 4 mg via INTRAVENOUS
  Filled 2015-05-24: qty 2

## 2015-05-24 MED ORDER — SODIUM CHLORIDE 0.9 % IV BOLUS (SEPSIS)
500.0000 mL | Freq: Once | INTRAVENOUS | Status: AC
  Administered 2015-05-24: 500 mL via INTRAVENOUS

## 2015-05-24 NOTE — ED Notes (Signed)
Patient states left flank pain that started around lunch time with increasing pain since. States h/o kidney stones but states pain is different. Denies GI/GU s/s. Denies recent injury.

## 2015-05-24 NOTE — ED Notes (Signed)
Pt states pain has returned. EDP aware, order given.

## 2015-05-24 NOTE — ED Provider Notes (Signed)
CSN: [id number]     Arrival date & time 05/24/15  1823 History   First MD Initiated Contact with Patient 05/24/15 1839     Chief Complaint  Patient presents with  . Flank Pain  . Abdominal Pain  . Nausea      HPI  Pt was seen at 1845. Per pt, c/o sudden onset and persistence of constant left sided flank "pain" that began this afternoon PTA.  Pt describes the pain as "sharp," and "cramping," and radiating into the left side of her abd.  Has been associated with nausea.  Denies dysuria/hematuria, no abd pain, no vomiting/diarrhea, no black or blood in stools, no CP/SOB.      Past Medical History  Diagnosis Date  . Hypothyroidism   . Kidney stone   . DDD (degenerative disc disease), lumbar   . DDD (degenerative disc disease), cervical    Past Surgical History  Procedure Laterality Date  . Tubal ligation    . Breast enhancement surgery    . Colonoscopy N/A 11/07/2014    Procedure: COLONOSCOPY;  Surgeon: Danie Binder, MD;  Location: [facility];  Service: Endoscopy;  Laterality: N/A;  0830   . Blood patch      states cysts on spine were leaking and treated with blood patch   Family History  Problem Relation Age of Onset  . Colon cancer Neg Hx    Social History  Substance Use Topics  . Smoking status: Never Smoker   . Smokeless tobacco: None  . Alcohol Use: 0.0 oz/week    0 Standard drinks or equivalent per week     Comment: 1 mixed drink a day     Review of Systems ROS: Statement: All systems negative except as marked or noted in the HPI; Constitutional: Negative for fever and chills. ; ; Eyes: Negative for eye pain, redness and discharge. ; ; ENMT: Negative for ear pain, hoarseness, nasal congestion, sinus pressure and sore throat. ; ; Cardiovascular: Negative for chest pain, palpitations, diaphoresis, dyspnea and peripheral edema. ; ; Respiratory: Negative for cough, wheezing and stridor. ; ; Gastrointestinal: +nausea. Negative for vomiting, diarrhea, abdominal pain,  blood in stool, hematemesis, jaundice and rectal bleeding. . ; ; Genitourinary: Negative for dysuria, flank pain and hematuria. ; ; Musculoskeletal: +back pain. Negative for neck pain. Negative for swelling and trauma.; ; Skin: Negative for pruritus, rash, abrasions, blisters, bruising and skin lesion.; ; Neuro: Negative for headache, lightheadedness and neck stiffness. Negative for weakness, altered level of consciousness , altered mental status, extremity weakness, paresthesias, involuntary movement, seizure and syncope.      Allergies  Review of patient's allergies indicates no known allergies.  Home Medications   Prior to Admission medications   Medication Sig Start Date End Date Taking? Authorizing Provider  estrogen, conjugated,-medroxyprogesterone (PREMPRO) 0.625-2.5 MG per tablet Take 1 tablet by mouth daily.   Yes Historical Provider, MD  ibuprofen (ADVIL,MOTRIN) 200 MG tablet Take 200 mg by mouth every 6 (six) hours as needed for fever, mild pain or moderate pain.   Yes Historical Provider, MD  levothyroxine (SYNTHROID, LEVOTHROID) 50 MCG tablet Take 50 mcg by mouth daily before breakfast.   Yes Historical Provider, MD  pramoxine-hydrocortisone (PROCTOCREAM-HC) 1-1 % rectal cream USE PR 4 TIMES A DAY FOR 10 DAYS. Patient not taking: Reported on 05/24/2015 01/08/15   Danie Binder, MD   BP 159/96 mmHg  Pulse 63  Temp(Src) 98.1 F (36.7 C) (Oral)  Resp 22  Ht 5\' 7"  (  1.702 m)  Wt 178 lb (80.74 kg)  BMI 27.87 kg/m2  SpO2 100% Physical Exam  1850: Physical examination:  Nursing notes reviewed; Vital signs and O2 SAT reviewed;  Constitutional: Well developed, Well nourished, Well hydrated, Uncomfortable appearing. Crying and moaning loudly; Head:  Normocephalic, atraumatic; Eyes: EOMI, PERRL, No scleral icterus; ENMT: Mouth and pharynx normal, Mucous membranes moist; Neck: Supple, Full range of motion, No lymphadenopathy; Cardiovascular: Regular rate and rhythm, No murmur, rub, or  gallop; Respiratory: Breath sounds clear & equal bilaterally, No rales, rhonchi, wheezes.  Speaking full sentences with ease, Normal respiratory effort/excursion; Chest: Nontender, Movement normal; Abdomen: Soft, Nontender, Nondistended, Normal bowel sounds; Genitourinary: No CVA tenderness; Spine:  No midline CS, TS, LS tenderness. +TTP left lumbar paraspinal muscles. No rash.;; Extremities: Pulses normal, No tenderness, No edema, No calf edema or asymmetry.; Neuro: AA&Ox3, Major CN grossly intact.  Speech clear. No gross focal motor or sensory deficits in extremities. Climbs on and off stretcher easily by herself. Gait steady..;; Skin: Color normal, Warm, Dry.   ED Course  Procedures (including critical care time) Labs Review   Imaging Review  I have personally reviewed and evaluated these images and lab results as part of my medical decision-making.   EKG Interpretation None      MDM  MDM Reviewed: previous chart, nursing note and vitals Reviewed previous: labs Interpretation: labs and CT scan     Results for orders placed or performed during the hospital encounter of 05/24/15  Urinalysis, Routine w reflex microscopic  Result Value Ref Range   Color, Urine AMBER (A) YELLOW   APPearance CLOUDY (A) CLEAR   Specific Gravity, Urine 1.025 1.005 - 1.030   pH 6.5 5.0 - 8.0   Glucose, UA NEGATIVE NEGATIVE mg/dL   Hgb urine dipstick LARGE (A) NEGATIVE   Bilirubin Urine NEGATIVE NEGATIVE   Ketones, ur 15 (A) NEGATIVE mg/dL   Protein, ur 30 (A) NEGATIVE mg/dL   Urobilinogen, UA 0.2 0.0 - 1.0 mg/dL   Nitrite NEGATIVE NEGATIVE   Leukocytes, UA TRACE (A) NEGATIVE  Comprehensive metabolic panel  Result Value Ref Range   Sodium 139 135 - 145 mmol/L   Potassium 3.5 3.5 - 5.1 mmol/L   Chloride 106 101 - 111 mmol/L   CO2 26 22 - 32 mmol/L   Glucose, Bld 120 (H) 65 - 99 mg/dL   BUN 17 6 - 20 mg/dL   Creatinine, Ser 0.85 0.44 - 1.00 mg/dL   Calcium 8.9 8.9 - 10.3 mg/dL   Total Protein  6.8 6.5 - 8.1 g/dL   Albumin 3.9 3.5 - 5.0 g/dL   AST 13 (L) 15 - 41 U/L   ALT 12 (L) 14 - 54 U/L   Alkaline Phosphatase 66 38 - 126 U/L   Total Bilirubin 0.6 0.3 - 1.2 mg/dL   GFR calc non Af Amer >60 >60 mL/min   GFR calc Af Amer >60 >60 mL/min   Anion gap 7 5 - 15  Lipase, blood  Result Value Ref Range   Lipase 24 22 - 51 U/L  CBC with Differential  Result Value Ref Range   WBC 7.9 4.0 - 10.5 K/uL   RBC 4.22 3.87 - 5.11 MIL/uL   Hemoglobin 12.9 12.0 - 15.0 g/dL   HCT 38.5 36.0 - 46.0 %   MCV 91.2 78.0 - 100.0 fL   MCH 30.6 26.0 - 34.0 pg   MCHC 33.5 30.0 - 36.0 g/dL   RDW 12.5 11.5 - 15.5 %  Platelets 268 150 - 400 K/uL   Neutrophils Relative % 69 %   Neutro Abs 5.4 1.7 - 7.7 K/uL   Lymphocytes Relative 23 %   Lymphs Abs 1.8 0.7 - 4.0 K/uL   Monocytes Relative 6 %   Monocytes Absolute 0.5 0.1 - 1.0 K/uL   Eosinophils Relative 2 %   Eosinophils Absolute 0.2 0.0 - 0.7 K/uL   Basophils Relative 0 %   Basophils Absolute 0.0 0.0 - 0.1 K/uL  Urine microscopic-add on  Result Value Ref Range   Squamous Epithelial / LPF FEW (A) RARE   WBC, UA 3-6 <3 WBC/hpf   RBC / HPF TOO NUMEROUS TO COUNT <3 RBC/hpf   Bacteria, UA FEW (A) RARE   Ct Renal Stone Study 05/24/2015  CLINICAL DATA:  Left flank pain, onset 8 hr prior. History kidney stones. EXAM: CT ABDOMEN AND PELVIS WITHOUT CONTRAST TECHNIQUE: Multidetector CT imaging of the abdomen and pelvis was performed following the standard protocol without IV contrast. COMPARISON:  CT 02/2014 FINDINGS: Lower chest: Mild subsegmental atelectasis or scarring at the right lung base. Bilateral breast implants noted. Liver: Mild focal fatty infiltration adjacent with falciform ligament. Otherwise unremarkable noncontrast appearance. Hepatobiliary: Gallbladder physiologically distended. No biliary dilatation. Pancreas: Normal noncontrast appearance. Spleen: Normal. Adrenal glands: No nodule. Kidneys: There is a partially obstructing 4 x 7 mm stone  in the left proximal ureter just beyond the ureteropelvic junction at the level of L3. Mild resultant hydronephrosis and minimal perinephric stranding. No additional nonobstructing stones. No urolithiasis involving the right kidney collecting system. Stomach/Bowel: Stomach physiologically distended. There are no dilated or thickened small bowel loops. Moderate volume of stool in the right colon, small volume of stool throughout the remainder of the colon. No colonic wall thickening. The appendix is normal. Vascular/Lymphatic: No retroperitoneal adenopathy. Abdominal aorta is normal in caliber. Reproductive: The uterus is retroverted. Bilateral tubal ligation clips are noted. The ovaries are symmetric in size, no adnexal mass. Bladder: Physiologically distended, no wall thickening. Other: No free air, free fluid, or intra-abdominal fluid collection. Musculoskeletal: There are no acute or suspicious osseous abnormalities. Scoliotic curvature of the spine with associated degenerative change, stable in degree from prior. IMPRESSION: Partially obstructing 4 x 7 mm stone in the left proximal ureter just beyond the ureteropelvic junction with mild resultant hydronephrosis. Electronically Signed   By: Jeb Levering M.D.   On: 05/24/2015 20:51    2220:  Feels much better after meds and wants to go home now. Pt is unsure if she can have NSAID s/p her recent multiple LP's; states she will check with her Neurosurgeon tomorrow before starting. Tx symptomatically, f/u Uro. Dx and testing d/w pt and family.  Questions answered.  Verb understanding, agreeable to d/c home with outpt f/u.   Francine Graven, DO 05/27/15 2230

## 2015-05-24 NOTE — Discharge Instructions (Signed)
°Emergency Department Resource Guide °1) Find a Doctor and Pay Out of Pocket °Although you won't have to find out who is covered by your insurance plan, it is a good idea to ask around and get recommendations. You will then need to call the office and see if the doctor you have chosen will accept you as a new patient and what types of options they offer for patients who are self-pay. Some doctors offer discounts or will set up payment plans for their patients who do not have insurance, but you will need to ask so you aren't surprised when you get to your appointment. ° °2) Contact Your Local Health Department °Not all health departments have doctors that can see patients for sick visits, but many do, so it is worth a call to see if yours does. If you don't know where your local health department is, you can check in your phone book. The CDC also has a tool to help you locate your state's health department, and many state websites also have listings of all of their local health departments. ° °3) Find a Walk-in Clinic °If your illness is not likely to be very severe or complicated, you may want to try a walk in clinic. These are popping up all over the country in pharmacies, drugstores, and shopping centers. They're usually staffed by nurse practitioners or physician assistants that have been trained to treat common illnesses and complaints. They're usually fairly quick and inexpensive. However, if you have serious medical issues or chronic medical problems, these are probably not your best option. ° °No Primary Care Doctor: °- Call [facility] at  832-8000 - they can help you locate a primary care doctor that  accepts your insurance, provides certain services, etc. °- Physician Referral Service- 1-800-533-3463 ° °Chronic Pain Problems: °Organization         Address  Phone   Notes  °[facility]  (336) 297-2271 Patients need to be referred by their primary care doctor.  ° °Medication  Assistance: °Organization         Address  Phone   Notes  °[place] [facility] [address] °[place], [place] [address] (336) 641-8030 --Must be a resident of [place] °-- Must have NO insurance coverage whatsoever (no Medicaid/ Medicare, etc.) °-- The pt. MUST have a primary care doctor that directs their care regularly and follows them in the community °  °[facility]  (866) 331-1348   °[employer]  (888) 892-1162   ° °Agencies that provide inexpensive medical care: °Organization         Address  Phone   Notes  °[facility]  (336) 832-8035   °[facility]    (336) 832-7272   °[facility] [address] °[place], [place] [address] (336) 832-4777   °[facility] [address], °[place] (336) 271-4999   °[facility]    (336) 373-0678   °[facility]    (336) 272-1050   °[facility] ° [address], [place] Phone:  (336) 832-4444, Fax:  (336) 832-4440 Hours of Operation:  9 am - 6 pm, M-F.  Also accepts Medicaid/Medicare and self-pay.  °[facility] ° [address], [place] Phone: (336) 832-3150, Fax: (336) [phone]. Hours of Operation:  8:30 am - 5:30 pm, M-F.  Also accepts Medicaid and self-pay.  °[facility] [place] [address], [place] Phone: (336) 878-6027   °[facility] [address], [place], [place] (336)723-1848, Ext. 123 Mondays & Thursdays: 7-9 AM.  First 15 patients are seen on a first come, first serve basis. °  ° °Medicaid-accepting [place] Providers: ° °Organization         Address  Phone   Notes  °[facility] [address], [place] (336) 641-2100 Also accepts self-pay patients.  °[facility] [address], [place] ° (336) 856-9996   °[facility] [address], [place]  (336) 288-8857   °[facility] [address] [place] [address], [place] (336) 299-7000   °Veita Bland [address], [place]  ° (336) 373-1557 Only accepts [place] Access Medicaid patients after they have their name applied to their card.  ° °Self-Pay (no insurance) in [place]: ° °Organization         Address  Phone   Notes  °Sickle Cell Patients, [facility] [address], [place] (336) 832-1970   °[facility] [address], [place] (336) 832-4400   °[facility] ° [address], [place] (336) 992-4800   °[facility]/Dr. Osei-Bonsu ° [address] [place] [address], [place] or [address], [place] (336) 841-8500 Phone number for both [place] and [place] locations is the same.  °[facility] [address], [place] (336) 299-0000   °[facility] [address] [place] [address], [place] or [address] (336) 852-7530 °(336) 878-2260   °[facility] [address], [place] (336) 350-1642, phone; (336) 294-5005, fax Sees patients 1st and 3rd Saturday of every month.  Must not qualify for public or private insurance (i.e. Medicaid, Medicare, [place] Health Choice, Veterans' Benefits) • Household income should be no more than 200% of the poverty level •The clinic cannot treat you if you are pregnant or think you are pregnant • Sexually transmitted diseases are not treated at the clinic.  ° ° °Dental Care: °Organization         Address  Phone  Notes  °[place] [facility] [address], [place] (336) 641-6152 Accepts children up to age 21 who are enrolled in Medicaid or [place] Health Choice; pregnant women with a Medicaid card; and children who have applied for Medicaid or [place] Health Choice, but were declined, whose parents can pay a reduced fee at time of service.  °[place]  [facility] [place]  [address], [place] (336) 641-7733 Accepts children up to age 21 who are enrolled in Medicaid or [place] Health Choice; pregnant women with a Medicaid card; and children who have applied for Medicaid or [place] Health Choice, but were declined, whose parents can pay a reduced fee at time of service.  °[facility] ° [address], [place] (336) 641-4533 Patients are seen by appointment only. Walk-ins are not accepted. [facility] will see patients 18 years of age and older. °Monday - Tuesday (8am-5pm) °Most Wednesdays (8:30-5pm) °$30 per visit, cash only  °[facility] ° [address], [place] (336) 641-4533 Patients are seen by appointment only. Walk-ins are not accepted. [facility] will see patients 18 years of age and older. °One   Wednesday Evening (Monthly: Volunteer Based).  $30 per visit, cash only  °[facility]  (919) 537-3737 for adults; Children under age 4, call Graduate Pediatric Dentistry at (919) [phone]. Children aged 4-14, please call (919) [phone] to request a pediatric application. ° Dental services are provided in all areas of dental care including fillings, crowns and bridges, complete and partial dentures, implants, gum treatment, root canals, and extractions. Preventive care is also provided. Treatment is provided to both adults and children. °Patients are selected via a lottery and there is often a waiting list. °  °[facility] [address], °[place] ° (336) 763-8833 www.drcivils.com °  °[facility] [address], [place], [place] (336)723-1848, Ext. 123 Second and Fourth Thursday of each month, opens at 6:30 AM; Clinic ends at 9 AM.  Patients are seen on a first-come first-served basis, and a limited number are seen during each clinic.  ° °[facility] ° [address], [place], [place] (336) 723-7904    Eligibility Requirements °You must have lived in [place], [place], or [place] counties for at least the last three months. °  You cannot be eligible for state or federal sponsored healthcare insurance, including Veterans Administration, Medicaid, or Medicare. °  You generally cannot be eligible for healthcare insurance through your employer.  °  How to apply: °Eligibility screenings are held every Tuesday and Wednesday afternoon from 1:00 pm until 4:00 pm. You do not need an appointment for the interview!  °[facility] [address], [place], [place] 336-631-2330   °[facility]  336-342-8273   °[place] [facility]  336-703-3100   °[facility]  336-570-6415   ° °Behavioral Health Resources in the Community: °Intensive Outpatient Programs °Organization         Address  Phone  Notes  °[facility] [address], [place], [place] 336-878-6098   °[facility] [address], [place], [place] 336-832-9800   °[facility] [address], [place], [place] ° 336-882-2125   °[facility] [address],  °[place], [place] 1-800-853-5163 or 336-641-4981   °Substance Abuse Resources °Organization         Address  Phone  Notes  °[facility]  336-882-2125   °[facility]  336-784-9470   °[facility]  336-285-9073   °[facility]  336-845-3988   °Residential & Outpatient Substance Abuse Program  1-800-659-3381   °Psychological Services °Organization         Address  Phone  Notes  °[facility]  336- 832-9600   °[facility]  336- 378-7881   °[facility] [address], [place] 1-800-853-5163 or 336-641-4981   ° °Mobile Crisis Teams °Organization         Address  Phone  Notes  °[facility], Mobile Crisis Care Unit  1-877-626-1772   °Assertive °[facility] ° [address]  [place], [place] 336-834-9664   °Sharon DeEsch [address] °[place]  336-554-5454   ° °Self-Help/Support Groups °Organization         Address  Phone             Notes  °[facility]  - variety of support groups  336- 373-1402 Call for more information  °Narcotics Anonymous (NA), [facility] [address], °[place]   2 meetings at this location  ° °  Residential Treatment Programs Organization         Address  Phone  Notes  [facility] [address],    [place]  1-8570467222   [facility]  [address] [place] [address], [place], [place]   [place], [place] 267-142-0146 Admissions: 8am-3pm M-F  [facility] [place] [address]    [place], [place] 496-759-1638   [facility] [address] [place], [place], [place]   [facility] [address]  [place], [place]   [facility] Intensive Outpatient [address], [place], [place]   [facility] [address]  [place], [place] 1-(260)751-7651 or 628-564-5542   [facility] [address] [place], [place] Accepts Medicaid  [facility] [address]  [place] 1-825-248-3090 Substance Abuse/Addiction Treatment   [facility] Organization         Address  Phone  Notes  [facility]  262-588-2521   Domenic Schwab, PhD [address] Arlis [place]a [place], [place]   6367192766 or (407) 749-4753   [facility], [place] 972-220-3673   [facility] [address], [place], [place] (364)571-5326 Insurance/Medicaid/sponsorship through [facility] [address]                                    [place], [place] [phone] [place] [address], [place] 620-299-5365    Dr. Adele Schilder  [phone]   [facility] Dept. 1) [address] [place] 2) [place] 3)  [address], [place] (585)[phone]  [phone]   [place] 6828483741 or [phone] (After Hours)      Take the prescriptions as directed.  Call your Neurosurgeon tomorrow to make sure it is OK to take ibuprofen (ie: motrin, advil). If he says it is OK: start take over the counter ibuprofen, 2 tablets by mouth every 4 hours with food, for the next week.  Call the Urologist Monday to schedule a follow up appointment this week.  Return to the Emergency Department immediately if worsening.

## 2015-05-24 NOTE — ED Notes (Signed)
Pt alert & oriented x4, stable gait. Patient given discharge instructions, paperwork & prescription(s). Patient informed not to drive, operate any equipment & handel any important documents 4 hours after taking pain medication. Patient  instructed to stop at the registration desk to finish any additional paperwork. Patient  verbalized understanding. Pt left department w/ no further questions. 

## 2015-05-24 NOTE — ED Notes (Signed)
MD at bedside. 

## 2015-05-24 NOTE — ED Notes (Addendum)
Pt c/o left flank pain that started today around noon. Pt reports it radiates around to her left side of abdomen. Pt's left flank area very tender to touch. Pt reports 1 episode of nausea around 1800 tonight while attempting to get into car to come to ED. Denies any urinary or bowel symptoms. Pt has hx of kidney stones on right side but states "this pain feels completely different". Pt has difficulty ambulating due to pain. Pt very tearful upon assessment.

## 2015-05-25 MED FILL — Oxycodone w/ Acetaminophen Tab 5-325 MG: ORAL | Qty: 6 | Status: AC

## 2015-05-26 LAB — URINE CULTURE

## 2015-05-29 ENCOUNTER — Ambulatory Visit (INDEPENDENT_AMBULATORY_CARE_PROVIDER_SITE_OTHER): Payer: 59 | Admitting: Urology

## 2015-05-29 ENCOUNTER — Other Ambulatory Visit: Payer: Self-pay | Admitting: Urology

## 2015-05-29 ENCOUNTER — Ambulatory Visit (HOSPITAL_COMMUNITY)
Admission: RE | Admit: 2015-05-29 | Discharge: 2015-05-29 | Disposition: A | Discharge status: Home / Self Care | Payer: Managed Care, Other (non HMO) | Source: Ambulatory Visit | Attending: Urology | Admitting: Urology

## 2015-05-29 DIAGNOSIS — N201 Calculus of ureter: Secondary | ICD-10-CM | POA: Diagnosis not present

## 2015-06-24 ENCOUNTER — Other Ambulatory Visit: Payer: Self-pay | Admitting: Urology

## 2015-06-24 ENCOUNTER — Ambulatory Visit (HOSPITAL_COMMUNITY)
Admission: RE | Admit: 2015-06-24 | Discharge: 2015-06-24 | Disposition: A | Discharge status: Home / Self Care | Payer: Managed Care, Other (non HMO) | Source: Ambulatory Visit | Attending: Urology | Admitting: Urology

## 2015-06-24 DIAGNOSIS — N201 Calculus of ureter: Secondary | ICD-10-CM

## 2015-06-26 ENCOUNTER — Ambulatory Visit (INDEPENDENT_AMBULATORY_CARE_PROVIDER_SITE_OTHER): Payer: Managed Care, Other (non HMO) | Admitting: Urology

## 2015-06-26 DIAGNOSIS — N201 Calculus of ureter: Secondary | ICD-10-CM

## 2015-09-15 ENCOUNTER — Encounter: Payer: Self-pay | Admitting: Allergy and Immunology

## 2015-09-15 ENCOUNTER — Ambulatory Visit (INDEPENDENT_AMBULATORY_CARE_PROVIDER_SITE_OTHER): Payer: BLUE CROSS/BLUE SHIELD | Admitting: Allergy and Immunology

## 2015-09-15 VITALS — BP 122/68 | HR 70 | Temp 98.1°F | Resp 18 | Ht 67.32 in | Wt 179.0 lb

## 2015-09-15 DIAGNOSIS — L299 Pruritus, unspecified: Secondary | ICD-10-CM | POA: Diagnosis not present

## 2015-09-15 DIAGNOSIS — J310 Chronic rhinitis: Secondary | ICD-10-CM | POA: Diagnosis not present

## 2015-09-15 DIAGNOSIS — R21 Rash and other nonspecific skin eruption: Secondary | ICD-10-CM

## 2015-09-15 NOTE — Progress Notes (Signed)
NEW PATIENT NOTE  RE: Anna Orr MRN: [id number] DOB: November 08, 1965 [facility] [place] [address] [place], [place] [address] Date of Office Visit: 09/15/2015  Dear Jake Samples, PA-C:  I had the pleasure of seeing Anna Orr today in initial evaluation as you recall-- Subjective:  Anna Orr is a 50 y.o. female who presents today for Allergic Reaction  Assessment:   1. Episode of Itching and tingling sensation with patient, concern for allergic reaction.    2. Patient report of history of eczematous rash, with clear, skin today.    3. Chronic rhinitis.   4.      Negative selective food testing today. Plan:    Patient Instructions  1. Avoidance: Mite, Mold and Pollen 2. Antihistamine: Claritin 10mg  by mouth once daily for runny nose or itching as needed. 3. Nasal Spray: Saline 2 spray(s) each nostril once daily for stuffy nose or drainage.  4. Obtain labs at [facility] selected foods=peanut, tree nuts and alpha gal. 5.  Follow up Visit:  2 months or sooner if needed.  HPI: Anna Orr presents to the office with concern for food allergy.  She reports a mild history of eczema as a teenager with typically dry skin recently, though not "sensitive".  She has no restrictions to her lotions, soaps or detergents and feels she tolerates all exposures/environments without recurring difficulty.  However, in December 2016, she was concerned about nuts contributing to itchy, irritated sensations on several occasions.  The first was after eating Honey nut Cheerios 2 separate days with facial itching, numbness, lip tingling and a third difficulty after eating Snickers melted dip at [employer].  With the third episode she had  facial, chest, feet pruritic sensations without swelling, rash, hives, GI or respiratory symptoms.  She took Benadryl and symptoms resolved within 2 hours.  Upon further questioning she recall there was fruit cheese dip with bread, but  no other  foods.  She typically eats nuts without difficulty.  No restrictions to her diet. Generally dinners would include chicken, pork chops,  vegetables and rare beef. Often peanut butter sandwiches with crackers or chips.  She may note rare spring pollen, outdoor fluctuant weather pattern associated nasal congestion or rhinorrhea, but infrequent antihistamine use.  No recent medication administration including nonsteroidal or pain meds.  Last Diclofenac use was 4 months ago.  No recent labs. Denies ED or Urgent care visits, prednisone or antibiotic courses.  Medical History: Past Medical History  Diagnosis Date  . Hypothyroidism   . Kidney stone   . DDD (degenerative disc disease), lumbar   . DDD (degenerative disc disease), cervical    Surgical History: Past Surgical History  Procedure Laterality Date  . Tubal ligation    . Breast enhancement surgery    . Colonoscopy N/A 11/07/2014    Procedure: COLONOSCOPY;  Surgeon: Danie Binder, MD;  [facility];  Service: Endoscopy;  Laterality: N/A;  0830   . Blood patch      states cysts on spine were leaking and treated with blood patch   Family History: Family History  Problem Relation Age of Onset  . Colon cancer Neg Hx   . Allergic rhinitis Neg Hx   . Asthma Neg Hx   . Urticaria Neg Hx   . Eczema Sister    Social History: Social History  . Marital Status: Married    Spouse Name: N/A  . Number of Children: N/A  . Years  of Education: N/A   Social History Main Topics  . Smoking status: Never Smoker   . Smokeless tobacco: Not on file  . Alcohol Use: 0.0 oz/week    0 Standard drinks or equivalent per week     Comment: 1 mixed drink a day   . Drug Use: No  . 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  Anna Orr has a current medication list which includes the following prescription(s): estrogen  (conjugated)-medroxyprogesterone, ibuprofen, levothyroxine, and ondansetron.   Drug Allergies: No Known Allergies  Environmental History:  Anna Orr lives in a 52 year old house 15 years with carpeted floors, central air and heat; Stuffed mattress non-feather pillow and comforter without humidifier pets or smokers.  Review of Systems  Constitutional: Negative for fever, chills, weight loss, malaise/fatigue and diaphoresis.  HENT: Positive for congestion. Negative for ear pain, hearing loss, nosebleeds and sore throat.   Eyes: Negative for blurred vision, double vision, discharge and redness.       Corrective eyeglasses lenses, reading.  Respiratory: Negative for shortness of breath.        Denies history of bronchitis and pneumonia.  Gastrointestinal: Negative for heartburn, nausea, vomiting, abdominal pain, diarrhea and constipation.  Genitourinary: Negative.   Musculoskeletal: Positive for neck pain. Negative for myalgias.  Skin: Positive for itching. Negative for rash.       Reported mild eczema since a  teenager.  Neurological: Negative.  Negative for dizziness, seizures, weakness and headaches.  Endo/Heme/Allergies: Positive for environmental allergies.       Denies sensitivity to aspirin, NSAIDs, stinging insects, latex, jewelry and cosmetics.   Objective:   Filed Vitals:   09/15/15 1428  BP: 122/68  Pulse: 70  Temp: 98.1 F (36.7 C)  Resp: 18   Physical Exam  Constitutional: She is well-developed, well-nourished, and in no distress.  HENT:  Head: Atraumatic.  Right Ear: Tympanic membrane and ear canal normal.  Left Ear: Tympanic membrane and ear canal normal.  Nose: Mucosal edema present. No rhinorrhea. No epistaxis.  Mouth/Throat: Oropharynx is clear and moist and mucous membranes are normal. No oropharyngeal exudate, posterior oropharyngeal edema or posterior oropharyngeal erythema.  Eyes: Conjunctivae are normal.  Neck: Neck supple.  Cardiovascular: Normal rate, S1  normal and S2 normal.   No murmur heard. Pulmonary/Chest: Effort normal. She has no wheezes. She has no rhonchi. She has no rales.  Abdominal: Soft. Normal appearance and bowel sounds are normal.  Musculoskeletal: She exhibits no edema.  Lymphadenopathy:    She has no cervical adenopathy.  Neurological: She is alert.  Skin: Skin is warm and intact. No rash noted. No cyanosis. Nails show no clubbing.  Slight dryness.   Diagnostics: Skin testing:   Mild reactivity to selected grass pollens and mold species,  otherwise negative including selective foods peanut/tree nuts.    Anna M. Ishmael Holter, MD   cc: Jana Half

## 2015-09-15 NOTE — Patient Instructions (Signed)
Take Home Sheet  1. Avoidance: Mite, Mold and Pollen   2. Antihistamine: Claritin 10mg  by mouth once daily for runny nose or itching as needed.   3. Nasal Spray: Saline 2 spray(s) each nostril once daily for stuffy nose or drainage.    4. Obtain labs at [facility].  5.  Follow up Visit:  2 months or sooner if needed.   Websites that have reliable Patient information: 1. American Academy of Asthma, Allergy, & Immunology: www.aaaai.org 2. Food Allergy Network: www.foodallergy.org 3. Mothers of Asthmatics: www.aanma.org 4. [place]: DiningCalendar.de 5. American College of Allergy, Asthma, & Immunology: https://robertson.info/ or www.acaai.org

## 2015-09-30 ENCOUNTER — Other Ambulatory Visit: Payer: Self-pay | Admitting: Allergy and Immunology

## 2015-09-30 ENCOUNTER — Other Ambulatory Visit (HOSPITAL_COMMUNITY)
Admission: RE | Admit: 2015-09-30 | Discharge: 2015-09-30 | Disposition: A | Discharge status: Home / Self Care | Payer: BLUE CROSS/BLUE SHIELD | Source: Ambulatory Visit | Attending: Allergy and Immunology | Admitting: Allergy and Immunology

## 2015-09-30 DIAGNOSIS — L299 Pruritus, unspecified: Secondary | ICD-10-CM | POA: Insufficient documentation

## 2015-09-30 DIAGNOSIS — R21 Rash and other nonspecific skin eruption: Secondary | ICD-10-CM | POA: Insufficient documentation

## 2015-10-01 LAB — ALLERGY PANEL 18, NUT MIX GROUP
Almonds: <0.1 kU/L
Cashew IgE: <0.1 kU/L
Coconut: <0.1 kU/L
Peanut IgE: <0.1 kU/L
Sesame Seed f10: <0.1 kU/L

## 2015-10-01 LAB — ALLERGEN, PEANUT COMPONENT PANEL
Ara h 8 (f352): <0.1 kU/L
Ara h 9 (f427: <0.1 kU/L

## 2015-10-04 LAB — ALPHA-GAL PANEL: Galactose-alpha-1,3-galactose IgE: <0.1 kU/L (ref <0.35)

## 2016-01-28 DIAGNOSIS — J329 Chronic sinusitis, unspecified: Secondary | ICD-10-CM | POA: Diagnosis not present

## 2016-01-28 DIAGNOSIS — J069 Acute upper respiratory infection, unspecified: Secondary | ICD-10-CM | POA: Diagnosis not present

## 2016-01-28 DIAGNOSIS — Z1389 Encounter for screening for other disorder: Secondary | ICD-10-CM | POA: Diagnosis not present

## 2016-01-28 DIAGNOSIS — J343 Hypertrophy of nasal turbinates: Secondary | ICD-10-CM | POA: Diagnosis not present

## 2016-01-28 DIAGNOSIS — R070 Pain in throat: Secondary | ICD-10-CM | POA: Diagnosis not present

## 2016-01-28 DIAGNOSIS — Z6828 Body mass index (BMI) 28.0-28.9, adult: Secondary | ICD-10-CM | POA: Diagnosis not present

## 2016-02-05 DIAGNOSIS — M5412 Radiculopathy, cervical region: Secondary | ICD-10-CM | POA: Diagnosis not present

## 2016-02-05 DIAGNOSIS — Z01818 Encounter for other preprocedural examination: Secondary | ICD-10-CM | POA: Diagnosis not present

## 2016-02-11 DIAGNOSIS — E039 Hypothyroidism, unspecified: Secondary | ICD-10-CM | POA: Diagnosis not present

## 2016-02-11 DIAGNOSIS — M4712 Other spondylosis with myelopathy, cervical region: Secondary | ICD-10-CM | POA: Diagnosis not present

## 2016-02-11 DIAGNOSIS — M4802 Spinal stenosis, cervical region: Secondary | ICD-10-CM | POA: Diagnosis not present

## 2016-03-14 ENCOUNTER — Encounter (HOSPITAL_COMMUNITY): Payer: Self-pay | Admitting: Emergency Medicine

## 2016-03-14 ENCOUNTER — Emergency Department (HOSPITAL_COMMUNITY): Payer: BLUE CROSS/BLUE SHIELD

## 2016-03-14 ENCOUNTER — Emergency Department (HOSPITAL_COMMUNITY)
Admission: EM | Admit: 2016-03-14 | Discharge: 2016-03-14 | Disposition: A | Discharge status: Home / Self Care | Payer: BLUE CROSS/BLUE SHIELD | Attending: Emergency Medicine | Admitting: Emergency Medicine

## 2016-03-14 DIAGNOSIS — J029 Acute pharyngitis, unspecified: Secondary | ICD-10-CM | POA: Diagnosis not present

## 2016-03-14 DIAGNOSIS — E039 Hypothyroidism, unspecified: Secondary | ICD-10-CM | POA: Insufficient documentation

## 2016-03-14 DIAGNOSIS — R0602 Shortness of breath: Secondary | ICD-10-CM | POA: Diagnosis not present

## 2016-03-14 DIAGNOSIS — Z79899 Other long term (current) drug therapy: Secondary | ICD-10-CM | POA: Insufficient documentation

## 2016-03-14 DIAGNOSIS — R221 Localized swelling, mass and lump, neck: Secondary | ICD-10-CM | POA: Diagnosis not present

## 2016-03-14 LAB — BASIC METABOLIC PANEL
Anion gap: 7 (ref 5–15)
BUN: 16 mg/dL (ref 6–20)
CHLORIDE: 107 mmol/L (ref 101–111)
CO2: 25 mmol/L (ref 22–32)
Calcium: 8.9 mg/dL (ref 8.9–10.3)
Creatinine, Ser: 0.78 mg/dL (ref 0.44–1.00)
GFR calc non Af Amer: >60 mL/min (ref >60)
Glucose, Bld: 87 mg/dL (ref 65–99)
POTASSIUM: 3.9 mmol/L (ref 3.5–5.1)
SODIUM: 139 mmol/L (ref 135–145)

## 2016-03-14 LAB — CBC WITH DIFFERENTIAL/PLATELET
BASOS ABS: 0 10*3/uL (ref 0.0–0.1)
Basophils Relative: 0 %
EOS ABS: 0.2 10*3/uL (ref 0.0–0.7)
Eosinophils Relative: 2 %
HCT: 43.8 % (ref 36.0–46.0)
HEMOGLOBIN: 14.5 g/dL (ref 12.0–15.0)
LYMPHS ABS: 2 10*3/uL (ref 0.7–4.0)
LYMPHS PCT: 26 %
MCH: 30.7 pg (ref 26.0–34.0)
MCHC: 33.1 g/dL (ref 30.0–36.0)
MCV: 92.8 fL (ref 78.0–100.0)
Monocytes Absolute: 0.5 10*3/uL (ref 0.1–1.0)
Monocytes Relative: 7 %
NEUTROS PCT: 65 %
Neutro Abs: 4.9 10*3/uL (ref 1.7–7.7)
Platelets: 234 10*3/uL (ref 150–400)
RBC: 4.72 MIL/uL (ref 3.87–5.11)
RDW: 13.2 % (ref 11.5–15.5)
WBC: 7.6 10*3/uL (ref 4.0–10.5)

## 2016-03-14 MED ORDER — IOPAMIDOL (ISOVUE-300) INJECTION 61%
75.0000 mL | Freq: Once | INTRAVENOUS | Status: AC | PRN
  Administered 2016-03-14: 75 mL via INTRAVENOUS

## 2016-03-14 MED ORDER — PREDNISONE 10 MG PO TABS: 20.0000 mg | ORAL_TABLET | Freq: Every day | ORAL | 0 refills | Status: DC

## 2016-03-14 MED ORDER — DEXAMETHASONE SODIUM PHOSPHATE 10 MG/ML IJ SOLN
10.0000 mg | Freq: Once | INTRAMUSCULAR | Status: AC
  Administered 2016-03-14: 10 mg via INTRAVENOUS
  Filled 2016-03-14: qty 1

## 2016-03-14 MED ORDER — DIPHENHYDRAMINE HCL 50 MG/ML IJ SOLN
25.0000 mg | Freq: Once | INTRAMUSCULAR | Status: AC
  Administered 2016-03-14: 25 mg via INTRAVENOUS
  Filled 2016-03-14: qty 1

## 2016-03-14 NOTE — ED Provider Notes (Signed)
[facility] DEPT Provider Note   CSN: [id number] Arrival date & time: 03/14/16  1231  First Provider Contact:   First MD Initiated Contact with Patient 03/14/16 1356     By signing my name below, I, Rayna Sexton, attest that this documentation has been prepared under the direction and in the presence of Isla Pence, MD. Electronically Signed: Rayna Sexton, ED Scribe. 03/14/16. 2:00 PM.    History   Chief Complaint Chief Complaint  Patient presents with  . Shortness of Breath    HPI HPI Comments: Anna Orr is a 49 y.o. female who presents to the Emergency Department complaining of mild anterior neck swelling onset this morning. Pt had two disks replaced in her cervical spine 5 weeks ago and noticed her neck swelling upon waking this morning. She reports associated, mild, SOB, mild difficulty swallowing and denies having recently eaten due to her symptoms. Pt was instructed by her surgeon that she cannot take antiinflammatories. She denies any other associated symptoms at this time.   The history is provided by the patient and the spouse. No language interpreter was used.    Past Medical History:  Diagnosis Date  . DDD (degenerative disc disease), cervical   . DDD (degenerative disc disease), lumbar   . Hypothyroidism   . Kidney stone     Patient Active Problem List   Diagnosis Date Noted  . Coccyx pain 11/09/2014  . Special screening for malignant neoplasms, colon     Past Surgical History:  Procedure Laterality Date  . BACK SURGERY    . BLOOD PATCH     states cysts on spine were leaking and treated with blood patch  . BREAST ENHANCEMENT SURGERY    . COLONOSCOPY N/A 11/07/2014   Procedure: COLONOSCOPY;  Surgeon: Danie Binder, MD;  Location[facility];  Service: Endoscopy;  Laterality: N/A;  0830   . TUBAL LIGATION      OB History    No data available       Home Medications    Prior to Admission medications   Medication Sig Start Date End  Date Taking? Authorizing Provider  diazepam (VALIUM) 2 MG tablet Take 1 tablet by mouth daily as needed for anxiety.  02/12/16  Yes Historical Provider, MD  estrogen, conjugated,-medroxyprogesterone (PREMPRO) 0.625-2.5 MG per tablet Take 1 tablet by mouth daily. Reported on 09/15/2015   Yes Historical Provider, MD  ibuprofen (ADVIL,MOTRIN) 200 MG tablet Take 200 mg by mouth every 6 (six) hours as needed for fever, mild pain or moderate pain.   Yes Historical Provider, MD  ibuprofen (ADVIL,MOTRIN) 800 MG tablet Take 1 tablet by mouth daily as needed for moderate pain.  12/17/15  Yes Historical Provider, MD  levothyroxine (SYNTHROID, LEVOTHROID) 50 MCG tablet Take 50 mcg by mouth daily before breakfast.   Yes Historical Provider, MD  oxyCODONE (OXY IR/ROXICODONE) 5 MG immediate release tablet Take 1 tablet by mouth daily as needed for pain. 02/12/16  Yes Historical Provider, MD  tiZANidine (ZANAFLEX) 2 MG tablet Take 1 tablet by mouth 3 (three) times daily as needed for muscle spasms.  02/12/16 02/11/17 Yes Historical Provider, MD  oxyCODONE-acetaminophen (PERCOCET/ROXICET) 5-325 MG tablet 1 or 2 tabs PO q6h prn pain Patient not taking: Reported on 09/15/2015 05/24/15   Francine Graven, DO  predniSONE (DELTASONE) 10 MG tablet Take 2 tablets (20 mg total) by mouth daily. 03/14/16   Isla Pence, MD    Family History Family History  Problem Relation Age of Onset  .  Eczema Sister   . Colon cancer Neg Hx   . Allergic rhinitis Neg Hx   . Asthma Neg Hx   . Urticaria Neg Hx     Social History Social History  Substance Use Topics  . Smoking status: Never Smoker  . Smokeless tobacco: Never Used  . Alcohol use No     Comment: 1 mixed drink a day      Allergies   Review of patient's allergies indicates no known allergies.   Review of Systems Review of Systems  HENT: Positive for trouble swallowing.   Respiratory: Positive for shortness of breath.   Musculoskeletal: Positive for neck pain.  All other  systems reviewed and are negative.   Physical Exam Updated Vital Signs BP 111/79   Pulse 72   Temp 98.2 F (36.8 C)   Resp (!) 120   Ht 5\' 7"  (1.702 m)   Wt 180 lb (81.6 kg)   SpO2 99%   BMI 28.19 kg/m   Physical Exam  Constitutional: She is oriented to person, place, and time. She appears well-developed and well-nourished.  HENT:  Head: Normocephalic and atraumatic.  Eyes: EOM are normal.  Neck: Normal range of motion.  Swelling noted to anterior cervical region  Cardiovascular: Normal rate, regular rhythm and normal heart sounds.  Exam reveals no gallop and no friction rub.   No murmur heard. Pulmonary/Chest: Effort normal and breath sounds normal. No respiratory distress. She has no wheezes. She has no rales.  Abdominal: Soft. There is no tenderness.  Musculoskeletal: Normal range of motion.  Neurological: She is alert and oriented to person, place, and time.  Skin: Skin is warm and dry.  Psychiatric: She has a normal mood and affect.  Nursing note and vitals reviewed.  ED Treatments / Results  Labs (all labs ordered are listed, but only abnormal results are displayed) Labs Reviewed  BASIC METABOLIC PANEL  CBC WITH DIFFERENTIAL/PLATELET    EKG  EKG Interpretation None       Radiology Dg Chest 2 View  Result Date: 03/14/2016 CLINICAL DATA:  50 year old female who awoke with sore throat and shortness of breath. Neck surgery in July. Initial encounter. EXAM: CHEST  2 VIEW COMPARISON:  Chest radiographs 09/08/2014. FINDINGS: Partially visible lower cervical ACDF hardware. Visualized tracheal air column is within normal limits. Lung volumes remain normal. Normal cardiac size and mediastinal contours. The lungs are clear. No pneumothorax or pleural effusion. Incidental breast implants. IMPRESSION: No acute cardiopulmonary abnormality. Electronically Signed   By: Genevie Ann M.D.   On: 03/14/2016 12:58   Ct Soft Tissue Neck W Contrast  Result Date: 03/14/2016 CLINICAL  DATA:  Mild anterior neck swelling beginning this morning. Cervical discectomy 5 weeks ago. EXAM: CT NECK WITH CONTRAST TECHNIQUE: Multidetector CT imaging of the neck was performed using the standard protocol following the bolus administration of intravenous contrast. CONTRAST:  45mL ISOVUE-300 IOPAMIDOL (ISOVUE-300) INJECTION 61% COMPARISON:  None available FINDINGS: Pharynx and larynx: No suspicious asymmetry or enhancement. Negative for edema or narrowing. Salivary glands: Asymmetric heterogeneous left parotid, likely due to patchy fatty atrophy given associated volume loss. No acute inflammation or stone. No discrete mass lesion. Thyroid: Upper limits of normal size.  No focal finding. Lymph nodes: Negative Vascular: Negative Limited intracranial: Negative Visualized orbits: Negative. Mastoids and visualized paranasal sinuses: Clear Skeleton: C5-6 and C6-7 ACDF with ventral plate and screw fixation. Reportedly this is a recent surgery. No evidence of hardware failure or fracture. No suspicious erosion or operative region  fluid collection. The scar is visible and unremarkable over the left anterior neck. Upper chest: Negative IMPRESSION: 1. Benign appearance of neck.  No explanation for swelling. 2. Recent C5-6 and C6-7 ACDF without adverse finding. Electronically Signed   By: Monte Fantasia M.D.   On: 03/14/2016 16:26    Procedures Procedures  DIAGNOSTIC STUDIES: Oxygen Saturation is 100% on RA, normal by my interpretation.    COORDINATION OF CARE: 2:00 PM Discussed next steps with pt. Pt verbalized understanding and is agreeable with the plan.    Medications Ordered in ED Medications  dexamethasone (DECADRON) injection 10 mg (10 mg Intravenous Given 03/14/16 1416)  diphenhydrAMINE (BENADRYL) injection 25 mg (25 mg Intravenous Given 03/14/16 1416)  iopamidol (ISOVUE-300) 61 % injection 75 mL (75 mLs Intravenous Contrast Given 03/14/16 1615)     Initial Impression / Assessment and Plan / ED Course   I have reviewed the triage vital signs and the nursing notes.  Pertinent labs & imaging results that were available during my care of the patient were reviewed by me and considered in my medical decision making (see chart for details).  Clinical Course  Pt is feeling better after the decadron and benadryl.  Her CT was ok.  She will be d/c'd on prednisone.  She knows to return if worse.  I personally performed the services described in this documentation, which was scribed in my presence. The recorded information has been reviewed and is accurate.   Final Clinical Impressions(s) / ED Diagnoses   Final diagnoses:  Pharyngitis    New Prescriptions New Prescriptions   PREDNISONE (DELTASONE) 10 MG TABLET    Take 2 tablets (20 mg total) by mouth daily.     Isla Pence, MD 03/14/16 [phone]

## 2016-03-14 NOTE — ED Triage Notes (Addendum)
Pt reports had back surgery on July 6th. Pt reports felt fine ever since nad reports woke up this am and reports "neck feels swollen and I cant catch my breath." pt alert and oriented. Airway patent. Pt denies pain. Lungs clear at time of auscultation by Nurse First.

## 2016-03-14 NOTE — Discharge Instructions (Signed)
Return immediately if swelling worsens.

## 2016-03-23 DIAGNOSIS — M5412 Radiculopathy, cervical region: Secondary | ICD-10-CM | POA: Diagnosis not present

## 2016-05-06 DIAGNOSIS — M5412 Radiculopathy, cervical region: Secondary | ICD-10-CM | POA: Diagnosis not present

## 2016-06-03 DIAGNOSIS — Z01419 Encounter for gynecological examination (general) (routine) without abnormal findings: Secondary | ICD-10-CM | POA: Diagnosis not present

## 2016-06-03 DIAGNOSIS — Z6830 Body mass index (BMI) 30.0-30.9, adult: Secondary | ICD-10-CM | POA: Diagnosis not present

## 2016-06-03 DIAGNOSIS — Z1239 Encounter for other screening for malignant neoplasm of breast: Secondary | ICD-10-CM | POA: Diagnosis not present

## 2016-06-07 DIAGNOSIS — R51 Headache: Secondary | ICD-10-CM | POA: Diagnosis not present

## 2016-06-07 DIAGNOSIS — M542 Cervicalgia: Secondary | ICD-10-CM | POA: Diagnosis not present

## 2016-06-14 DIAGNOSIS — Z1231 Encounter for screening mammogram for malignant neoplasm of breast: Secondary | ICD-10-CM | POA: Diagnosis not present

## 2016-06-14 DIAGNOSIS — Z9882 Breast implant status: Secondary | ICD-10-CM | POA: Diagnosis not present

## 2016-08-05 DIAGNOSIS — M50221 Other cervical disc displacement at C4-C5 level: Secondary | ICD-10-CM | POA: Diagnosis not present

## 2016-08-05 DIAGNOSIS — M4802 Spinal stenosis, cervical region: Secondary | ICD-10-CM | POA: Diagnosis not present

## 2016-08-05 DIAGNOSIS — M4712 Other spondylosis with myelopathy, cervical region: Secondary | ICD-10-CM | POA: Diagnosis not present

## 2016-08-05 DIAGNOSIS — M5412 Radiculopathy, cervical region: Secondary | ICD-10-CM | POA: Diagnosis not present

## 2016-08-05 DIAGNOSIS — Z981 Arthrodesis status: Secondary | ICD-10-CM | POA: Diagnosis not present

## 2016-10-06 DIAGNOSIS — I781 Nevus, non-neoplastic: Secondary | ICD-10-CM | POA: Diagnosis not present

## 2016-10-06 DIAGNOSIS — D2371 Other benign neoplasm of skin of right lower limb, including hip: Secondary | ICD-10-CM | POA: Diagnosis not present

## 2016-11-17 DIAGNOSIS — Z6829 Body mass index (BMI) 29.0-29.9, adult: Secondary | ICD-10-CM | POA: Diagnosis not present

## 2016-11-17 DIAGNOSIS — G8929 Other chronic pain: Secondary | ICD-10-CM | POA: Diagnosis not present

## 2016-11-17 DIAGNOSIS — E663 Overweight: Secondary | ICD-10-CM | POA: Diagnosis not present

## 2016-11-17 DIAGNOSIS — Z1389 Encounter for screening for other disorder: Secondary | ICD-10-CM | POA: Diagnosis not present

## 2016-12-16 DIAGNOSIS — E039 Hypothyroidism, unspecified: Secondary | ICD-10-CM | POA: Diagnosis not present

## 2016-12-16 DIAGNOSIS — K219 Gastro-esophageal reflux disease without esophagitis: Secondary | ICD-10-CM | POA: Diagnosis not present

## 2017-02-03 DIAGNOSIS — M5412 Radiculopathy, cervical region: Secondary | ICD-10-CM | POA: Diagnosis not present

## 2017-02-17 DIAGNOSIS — J069 Acute upper respiratory infection, unspecified: Secondary | ICD-10-CM | POA: Diagnosis not present

## 2017-02-17 DIAGNOSIS — Z6830 Body mass index (BMI) 30.0-30.9, adult: Secondary | ICD-10-CM | POA: Diagnosis not present

## 2017-02-17 DIAGNOSIS — Z1389 Encounter for screening for other disorder: Secondary | ICD-10-CM | POA: Diagnosis not present

## 2017-02-17 DIAGNOSIS — E6609 Other obesity due to excess calories: Secondary | ICD-10-CM | POA: Diagnosis not present

## 2017-03-11 IMAGING — RF DG MYELOGRAPHY LUMBAR INJ LUMBOSACRAL
12 of 15 series · 12 of 15 positions shown · non-contrast
Comparison: None

CLINICAL DATA: Cerebrospinal fluid leak. Headaches. Positional
headaches. Two prior blood patches with transient relief.
TECHNIQUE: Contiguous axial images were obtained through the Lumbar spine after
the intrathecal infusion of infusion. Coronal and sagittal
reconstructions were obtained of the axial image sets.

[Series 1: (hospital) · 1 of 1 slices shown]
[im 1/1]
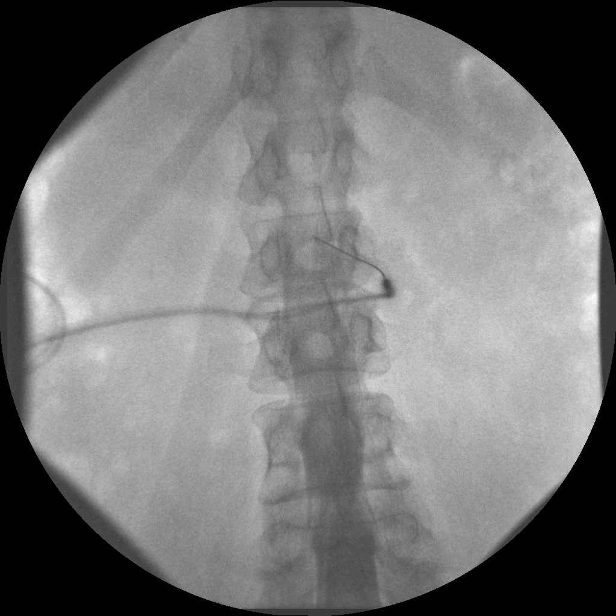

[Series 2: myelogram  white · 1 of 1 slices shown (1 of 11)]
[im 1/1]
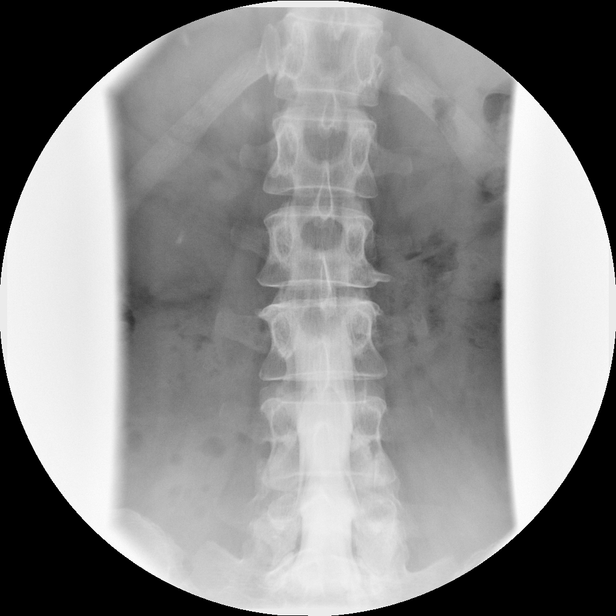

[Series 4: myelogram  white · 1 of 1 slices shown (2 of 11)]
[im 1/1]
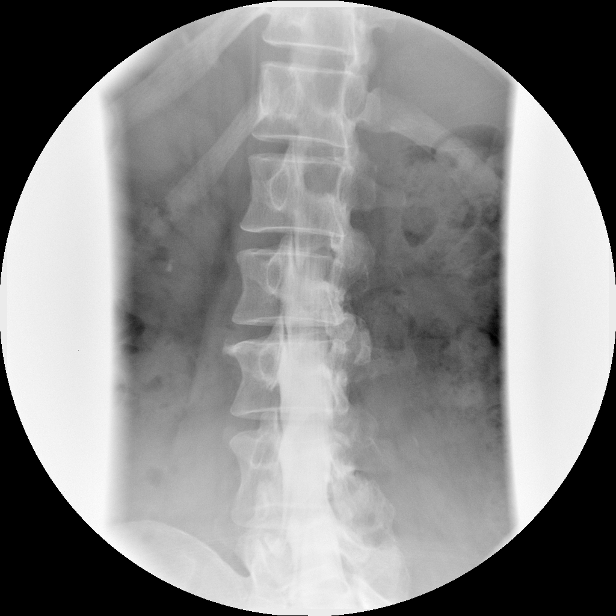

[Series 5: myelogram  white · 1 of 1 slices shown (3 of 11)]
[im 1/1]
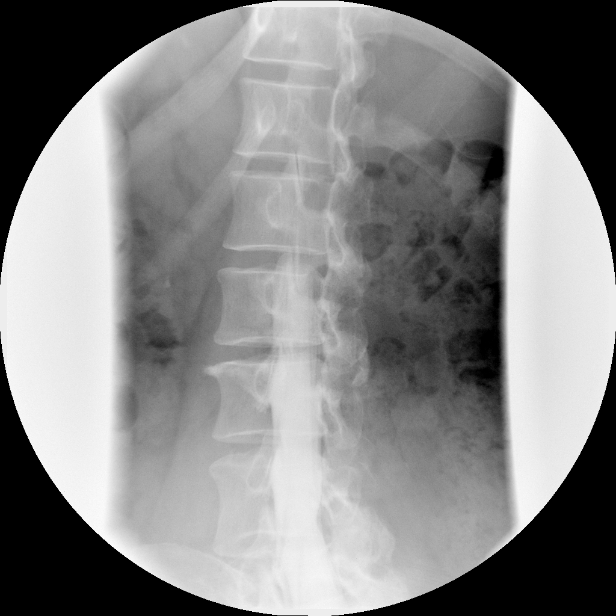

[Series 6: myelogram  white · 1 of 1 slices shown (4 of 11)]
[im 1/1]
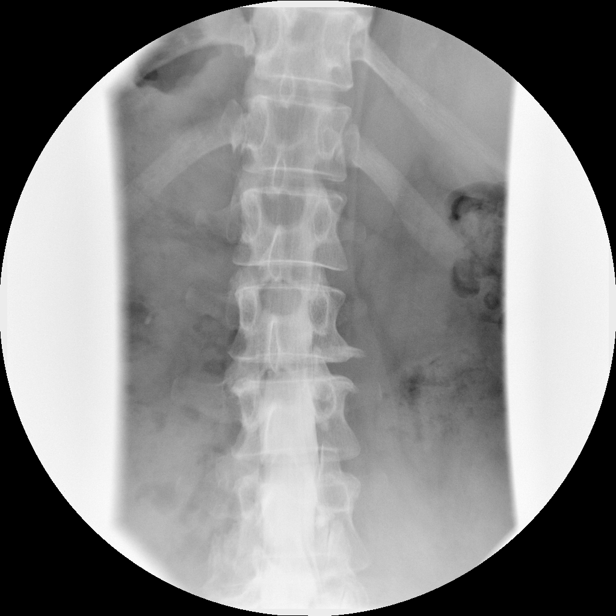

[Series 7: myelogram  white · 1 of 1 slices shown (5 of 11)]
[im 1/1]
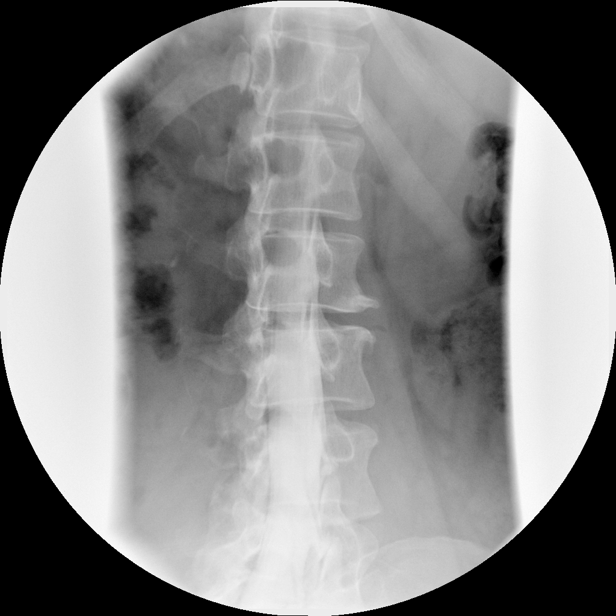

[Series 9: myelogram  white · 1 of 1 slices shown (6 of 11)]
[im 1/1]
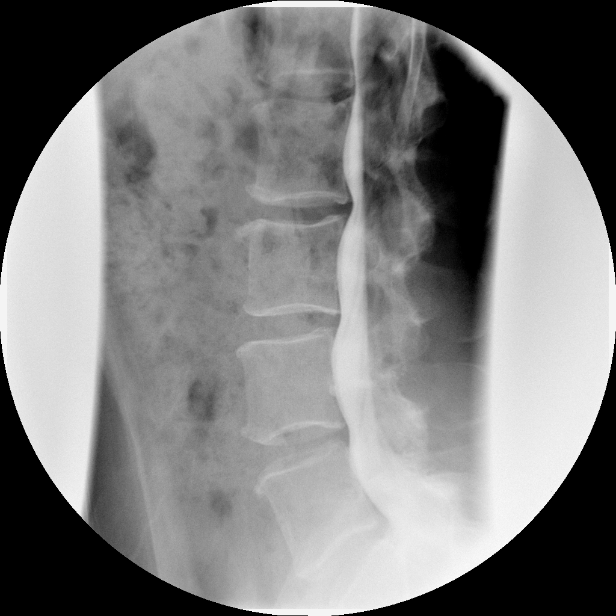

[Series 10: myelogram  white · 1 of 1 slices shown (7 of 11)]
[im 1/1]
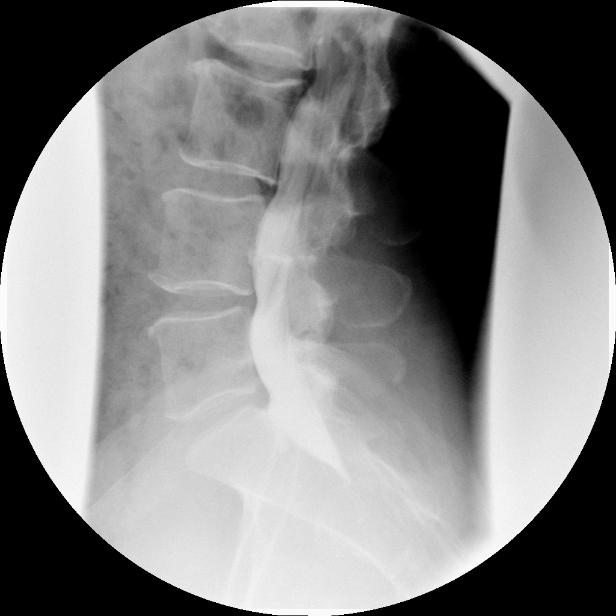

[Series 11: myelogram  white · 1 of 1 slices shown (8 of 11)]
[im 1/1]
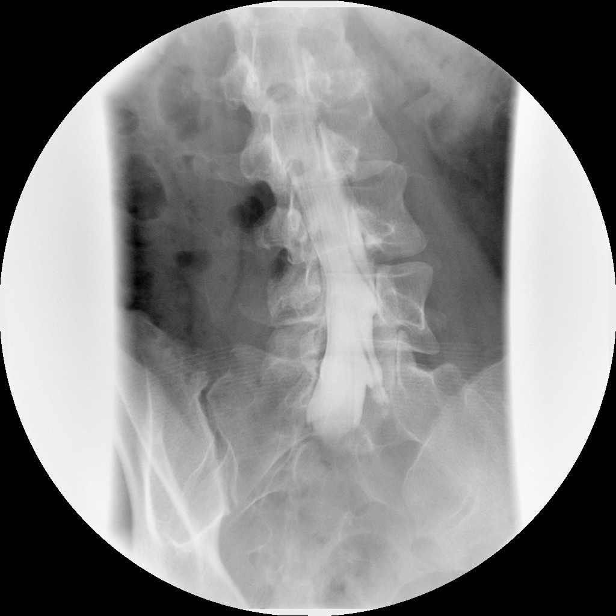

[Series 12: myelogram  white · 1 of 1 slices shown (9 of 11)]
[im 1/1]
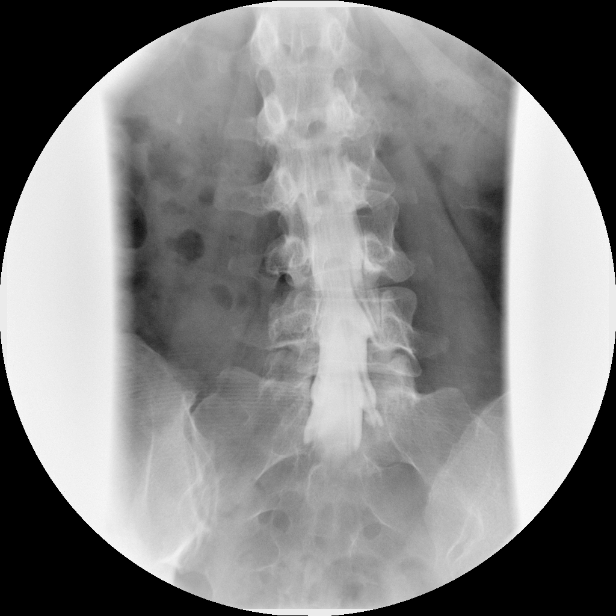

[Series 14: myelogram  white · 1 of 1 slices shown (10 of 11)]
[im 1/1]
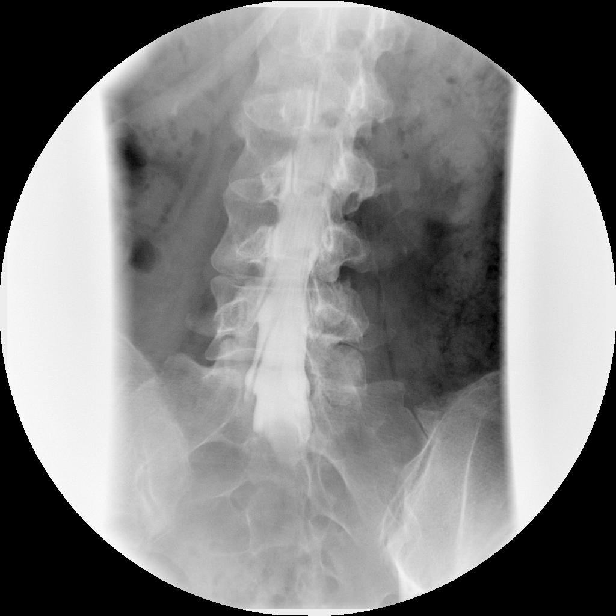

[Series 15: myelogram  white · 1 of 1 slices shown (11 of 11)]
[im 1/1]
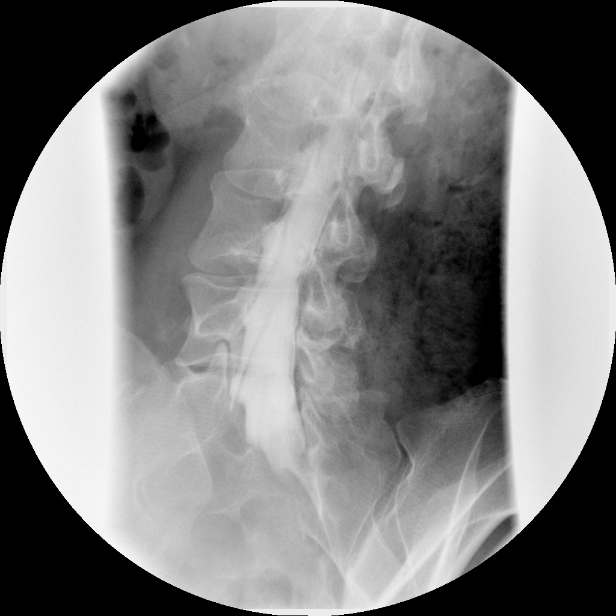

[12 of 15 positions shown; findings below may reference images not displayed]

EXAM:
LUMBAR MYELOGRAM

FLUOROSCOPY TIME:  1 minutes 1 second

Fifteen fluoroscopic spot films were obtained.

PROCEDURE:
After thorough discussion of risks and benefits of the procedure
including bleeding, infection, injury to nerves, blood vessels,
adjacent structures as well as headache and CSF leak, written and
oral informed consent was obtained. Consent was obtained by Dr.
Changhan Bailey. Time out form was completed.

Patient was positioned prone on the fluoroscopy table. Local
anesthesia was provided with 1% lidocaine without epinephrine after
prepped and draped in the usual sterile fashion. Puncture was
performed at L2-L3 using a 3 1/2 inch 22 gauge atraumatic (Aujla)
spinal needle via RIGHT paramedian approach. Using a single pass
through the dura, the needle was placed within the thecal sac, with
return of clear CSF. 15 mL of Rmnipaque-DK4 was injected into the
thecal sac, with normal opacification of the nerve roots and cauda
equina consistent with free flow within the subarachnoid space.

I personally performed the lumbar puncture and administered the
intrathecal contrast. I also personally supervised acquisition of
the myelogram images.
FINDINGS: LUMBAR MYELOGRAM FINDINGS:

On the standing upright images, there is a mild levoconvex
thoracolumbar curvature with the apex at T12-L1. Sacral Tarlov cysts
are noted. These will be further described on CT. The alignment on
the lateral projection is anatomic. There is poor range of motion
with flexion and extension images but no pathologic instability.
L2-L3 predominant degenerative disc disease is present with anterior
osteophytes.

CT LUMBAR MYELOGRAM FINDINGS:

Segmentation: The numbering convention used for this exam termed
L5-S1 as the last intervertebral disc space.

Alignment: Trace retrolisthesis of L 2 on L3 is associated with disc
space collapse. There is also trace retrolisthesis of L5 on S1 due
to degenerative disc disease. The levoconvex lumbar curve persists.

Vertebrae: No aggressive osseous lesions. Degenerative endplate
changes at L2-L3.

Conus medullaris: Normal at L1.

Sacral Tarlov cysts incidentally noted. At the S2-S3 junction, there
is a large LEFT eccentric Tarlov cyst that measures 28 mm
transversely. There is no extravasation of contrast from the Tarlov
cyst. A smaller Tarlov cyst is present on the RIGHT at the same
level.

Paraspinal tissues: LEFT inferior pole renal calculus measuring 5 mm
long axis.

Disc levels:

T12-L1:  Negative.

L1-L2:  Negative.

L2-L3: Disc desiccation and degeneration with LEFT eccentric disc
bulging. Disc bulging is circumferential. No central or foraminal
stenosis. Mild to moderate RIGHT facet arthrosis with vacuum joint.
LEFT facet joint appears normal. Nerve root sleeve cyst or
diverticulum does not fill with contrast. Correlating with the MRI,
the cyst is visible on the soft tissue windows.

L3-L4: Shallow LEFT eccentric disc bulging. There is a LEFT
foraminal and lateral disc protrusion with annular calcification.
This contacts the exiting LEFT L3 nerve lateral to the foramen. The
central canal, subarticular zones and foramina are adequately
patent.

L4-L5: Moderate to severe RIGHT and mild LEFT facet arthrosis is
present. Vacuum joint is present on the RIGHT. Nitrogen gas-filled
synovial cyst is present in the RIGHT lateral central canal. This
cyst is tiny and the gas component only measures 4 mm, without
significant mass effect. Both subarticular zones appear adequately
patent. Shallow circumferential disc bulging. The neural foramina
also appear adequately patent.

L5-S1: Shallow disc bulging but no stenosis. Mild to moderate
bilateral facet arthrosis. Mild ectasia of the RIGHT S1 nerve root
sleeve.
IMPRESSION: LUMBAR MYELOGRAM IMPRESSION:

1. Technically successful lumbar puncture with atraumatic spinal
needle for lumbar myelogram.
2. No gross pathologic instability. Flexion and extension is
suboptimal.
3. Mild levoconvex thoracolumbar scoliosis.

CT LUMBAR MYELOGRAM IMPRESSION:

1. L2-L3 predominant degenerative disc disease with LEFT eccentric
shallow disc bulging.
2. L3-L4 LEFT lateral shallow protrusion with annular calcification
potentially irritating the exited LEFT L3 nerve.
3. Multilevel facet arthrosis with a small gas-filled cyst arising
from the anterior RIGHT L4-L5 facet joint. No significant mass
effect.
4. Sacral Tarlov cysts and RIGHT L2-L3 foraminal perineural
cysts/nerve root diverticulum. No extravasation of contrast along
the length of the lumbar spine.

## 2017-05-05 ENCOUNTER — Ambulatory Visit (INDEPENDENT_AMBULATORY_CARE_PROVIDER_SITE_OTHER): Payer: BLUE CROSS/BLUE SHIELD | Admitting: Neurology

## 2017-05-05 ENCOUNTER — Encounter: Payer: Self-pay | Admitting: Neurology

## 2017-05-05 DIAGNOSIS — G4489 Other headache syndrome: Secondary | ICD-10-CM | POA: Diagnosis not present

## 2017-05-05 MED ORDER — GABAPENTIN 300 MG PO CAPS: 300.0000 mg | ORAL_CAPSULE | Freq: Two times a day (BID) | ORAL | 3 refills | Status: DC

## 2017-05-05 NOTE — Patient Instructions (Addendum)
   We will check MRI of the brain and start gabapentin for the headache taking 300 mg twice a day, call for any dose adjustments.  Neurontin (gabapentin) may result in drowsiness, ankle swelling, gait instability, or possibly dizziness. Please contact our office if significant side effects occur with this medication.   Need BUN and creatinine (Cr) on the blood work.

## 2017-05-05 NOTE — Progress Notes (Signed)
Reason for visit: Headache  Referring physician: Dr. Alfonse Alpers is a 51 y.o. female  History of present illness:  Anna Orr is a 51 year old right-handed white female with a history of chronic headaches have been present for about 3 years. The patient had spontaneous onset of pain in the top of the head, and in the neck and in the low back area that would come on together. The patient generally will have onset of a headache if she is exposed to a hot environment, or if she is trying to lift something that is heavy. She has not been able to exercise in part because of this. The patient will also note onset of headache if she is looking down such as reading a book for an extended period of time. She may feel better if she lies down. The patient denies any numbness or weakness of the extremities. She denies issues controlling the bowels or the bladder. She denies photophobia with the headache, but she does report a muffling of hearing during the headache episodes. She has undergone extensive evaluation at [facility], she had several myelogram studies, the opening pressure on 1 was 10 cm and the other was 17.5 cm of water. The patient did undergo MRI evaluation of the brain with and without contrast that did not show evidence of enhancement of the dura, there were subtle findings consistent with brain sagging. The study was done on 04/01/2015, she has not had another MRI since that time. The patient has had blood patch procedures done without benefit. The patient has had cervical spine surgery for neck discomfort, this has not helped the pain in the neck or back of the head. She has not been on any daily medications for prevention of the headache. Over-the-counter medication such as ibuprofen do not help much. She is sent to this office for further evaluation. During the headaches, the patient may have some slight gait instability, she has not had any falls. She reports no significant  problems with sinus drainage.  Past Medical History:  Diagnosis Date  . DDD (degenerative disc disease), cervical   . DDD (degenerative disc disease), lumbar   . Hypothyroidism   . Kidney stone     Past Surgical History:  Procedure Laterality Date  . BLOOD PATCH     states cysts on spine were leaking and treated with blood patch  . BREAST ENHANCEMENT SURGERY    . COLONOSCOPY N/A 11/07/2014   Procedure: COLONOSCOPY;  Surgeon: Danie Binder, MD;  Location: [facility];  Service: Endoscopy;  Laterality: N/A;  0830   . NECK SURGERY     2017  . TUBAL LIGATION      Family History  Problem Relation Age of Onset  . Eczema Sister   . Colon cancer Neg Hx   . Allergic rhinitis Neg Hx   . Asthma Neg Hx   . Urticaria Neg Hx     Social history:  reports that she has never smoked. She has never used smokeless tobacco. She reports that she does not drink alcohol or use drugs.  Medications:  Prior to Admission medications   Medication Sig Start Date End Date Taking? Authorizing Provider  Ascorbic Acid (VITAMIN C ADULT GUMMIES PO) Take by mouth.   Yes [provider]  ibuprofen (ADVIL,MOTRIN) 200 MG tablet Take 200 mg by mouth every 6 (six) hours as needed for fever, mild pain or moderate pain.   Yes [provider]  levothyroxine (  LEVOXYL) 50 MCG tablet Take by mouth. 10/01/15  Yes [provider]  levothyroxine (SYNTHROID, LEVOTHROID) 50 MCG tablet Take 50 mcg by mouth daily before breakfast.   Yes [provider]  OMEPRAZOLE PO Take 40 mg by mouth.   Yes [provider]     No Known Allergies  ROS:  Out of a complete 14 system review of symptoms, the patient complains only of the following symptoms, and all other reviewed systems are negative.  Weight gain Swollen lymph nodes Achy muscles Decreased energy  Blood pressure 133/89, pulse 70, height 5\' 7"  (1.702 m), weight 203 lb 8 oz (92.3 kg).  Physical Exam  General: The patient  is alert and cooperative at the time of the examination. The patient is moderately obese.  Eyes: Pupils are equal, round, and reactive to light. Discs are flat bilaterally.  Neck: The neck is supple, no carotid bruits are noted.  Respiratory: The respiratory examination is clear.  Cardiovascular: The cardiovascular examination reveals a regular rate and rhythm, no obvious murmurs or rubs are noted.  Neuromuscular: Range of movement of the cervical spine is relatively full.  Skin: Extremities are without significant edema.  Neurologic Exam  Mental status: The patient is alert and oriented x 3 at the time of the examination. The patient has apparent normal recent and remote memory, with an apparently normal attention span and concentration ability.  Cranial nerves: Facial symmetry is present. There is good sensation of the face to pinprick and soft touch bilaterally. The strength of the facial muscles and the muscles to head turning and shoulder shrug are normal bilaterally. Speech is well enunciated, no aphasia or dysarthria is noted. Extraocular movements are full. Visual fields are full. The tongue is midline, and the patient has symmetric elevation of the soft palate. No obvious hearing deficits are noted.  Motor: The motor testing reveals 5 over 5 strength of all 4 extremities. Good symmetric motor tone is noted throughout.  Sensory: Sensory testing is intact to pinprick, soft touch, vibration sensation, and position sense on all 4 extremities. No evidence of extinction is noted.  Coordination: Cerebellar testing reveals good finger-nose-finger and heel-to-shin bilaterally.  Gait and station: Gait is normal. Tandem gait is normal. Romberg is negative. No drift is seen.  Reflexes: Deep tendon reflexes are symmetric and normal bilaterally. Toes are downgoing bilaterally.   Assessment/Plan:  1. Episodic headache  The patient does have features that could be consistent with low  pressure headaches with headache in the top of the head, in the neck and low back so she with muffled hearing. Heat exposure seems to bring on the headaches as does physical activity. The prior MRI of the brain done did not show evidence of dural enhancement as would be expected with low pressure headaches. The patient will have a repeat MRI of the brain with and without gadolinium enhancement. The patient will be placed on gabapentin taking 300 mg twice daily. She will follow-up in 4 months.  Jill Alexanders MD 05/05/2017 11:07 AM  [facility] [address] [place], [place] [address]  Phone [phone] Fax [phone]

## 2017-05-10 ENCOUNTER — Telehealth: Payer: Self-pay | Admitting: Neurology

## 2017-05-10 NOTE — Telephone Encounter (Signed)
I spoke to the patient and schedule her MRI for Tuesday 05/16/17 at [facility] mobile unit. She informed me that she got blood work done 3 weeks ago. Stated her BUN results were 15 & Creatine was .83. She also informed me she took gabapentin for the first time this morning. She is worried about the side affects and she also said she takes an antiacid medication and is wondering if that is okay for her to take with the gabapentin.

## 2017-05-10 NOTE — Telephone Encounter (Signed)
I called patient. The gabapentin should be okay to take with antacids. The side effects to look up will be drowsiness or gait instability.  She will call me back if she has problems on the medication.

## 2017-05-11 DIAGNOSIS — Z6830 Body mass index (BMI) 30.0-30.9, adult: Secondary | ICD-10-CM | POA: Diagnosis not present

## 2017-05-11 DIAGNOSIS — Z1389 Encounter for screening for other disorder: Secondary | ICD-10-CM | POA: Diagnosis not present

## 2017-05-11 DIAGNOSIS — E6609 Other obesity due to excess calories: Secondary | ICD-10-CM | POA: Diagnosis not present

## 2017-05-11 DIAGNOSIS — L409 Psoriasis, unspecified: Secondary | ICD-10-CM | POA: Diagnosis not present

## 2017-05-16 ENCOUNTER — Ambulatory Visit (INDEPENDENT_AMBULATORY_CARE_PROVIDER_SITE_OTHER): Payer: BLUE CROSS/BLUE SHIELD

## 2017-05-16 DIAGNOSIS — G4489 Other headache syndrome: Secondary | ICD-10-CM

## 2017-05-16 MED ORDER — GADOPENTETATE DIMEGLUMINE 469.01 MG/ML IV SOLN: 19.0000 mL | Freq: Once | INTRAVENOUS | Status: DC | PRN

## 2017-05-18 ENCOUNTER — Telehealth: Payer: Self-pay | Admitting: Neurology

## 2017-05-18 NOTE — Telephone Encounter (Signed)
I called patient. MRI the brain does not show significant sagging of structures and does not show enhancement. No clear evidence of a low spinal fluid pressure headache.  The patient is only had one headache since being placed on gabapentin, we will continue the medication for now, if she wishes to go up on the dose, she is to call our office.    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 May consider dedicated pituitary imaging and hormonal evaluation if clinically indicated. - No evidence of dural enhancement to suggest intracranial hypotension.  - No acute findings. - No significant change from MRI on 04/01/15.

## 2017-06-05 ENCOUNTER — Telehealth: Payer: Self-pay | Admitting: Neurology

## 2017-06-05 MED ORDER — GABAPENTIN 300 MG PO CAPS: ORAL_CAPSULE | ORAL | 3 refills | Status: DC

## 2017-06-05 NOTE — Telephone Encounter (Signed)
Called patient. Relayed per Dr Krista Blue that she would recommend increasing gabapentin to 3x/day. She is tolerating 2x/day but feels it is ineffective. She would like to increase per CW,MD recommendation at last visit.   I went over instructions that she should take one in the morning and 2 capsules at night of the 300mg .  She verbalized understanding and appreciation for call. I verified pharmacy on file.E-scribed rx to pt pharmacy.

## 2017-06-05 NOTE — Telephone Encounter (Signed)
Per Dr Krista Blue- gave VO to increase gabapentin to 300mg  3x/day. Can do one in the am and 2 at night.

## 2017-06-05 NOTE — Telephone Encounter (Signed)
Pt called she is still having pressure in her head. The gabapentin does not seem to be working. She is taking a low dose, said Dr Viona Gilmore discussed possibly increasing if needed. Pt is aware he is out of the clinic today and this can wait until he returns.

## 2017-06-27 NOTE — Telephone Encounter (Signed)
Pt has called to provide an update re: her episodes she states that with increase in medication she still had 3 episodes last week and 1 this week, pt asking for a call

## 2017-06-27 NOTE — Telephone Encounter (Signed)
I called the patient.  The patient is on 900 mg daily of gabapentin, she is tolerating the dose but she does not believe it is helping her headache.  The Medicaid taking 600 mg twice daily, if the medication is not helping, we will need to try other medication such as nortriptyline or baclofen for her headache.

## 2017-07-24 MED ORDER — NORTRIPTYLINE HCL 10 MG PO CAPS: ORAL_CAPSULE | ORAL | 3 refills | Status: DC

## 2017-07-24 NOTE — Telephone Encounter (Signed)
I called the patient.  The patient cannot tolerate the gabapentin, it makes her symptoms worse.  She went off the drug.  I will try nortriptyline to see if this is helpful for her.

## 2017-07-24 NOTE — Addendum Note (Signed)
Addended by: Kathrynn Ducking on: 07/24/2017 05:04 PM   Modules accepted: Orders

## 2017-07-24 NOTE — Telephone Encounter (Signed)
Patient states she stopped taking gabapentin (NEURONTIN) 300 MG capsule about 2 weeks ago because medication was making pressure in her head worse and more often. Please call and discuss/

## 2017-08-02 DIAGNOSIS — Z6830 Body mass index (BMI) 30.0-30.9, adult: Secondary | ICD-10-CM | POA: Diagnosis not present

## 2017-08-02 DIAGNOSIS — H6983 Other specified disorders of Eustachian tube, bilateral: Secondary | ICD-10-CM | POA: Diagnosis not present

## 2017-08-02 DIAGNOSIS — J019 Acute sinusitis, unspecified: Secondary | ICD-10-CM | POA: Diagnosis not present

## 2017-08-02 DIAGNOSIS — Z1389 Encounter for screening for other disorder: Secondary | ICD-10-CM | POA: Diagnosis not present

## 2017-08-02 DIAGNOSIS — E6609 Other obesity due to excess calories: Secondary | ICD-10-CM | POA: Diagnosis not present

## 2017-08-30 DIAGNOSIS — M79671 Pain in right foot: Secondary | ICD-10-CM | POA: Diagnosis not present

## 2017-08-30 DIAGNOSIS — M7751 Other enthesopathy of right foot: Secondary | ICD-10-CM | POA: Diagnosis not present

## 2017-09-15 ENCOUNTER — Ambulatory Visit: Payer: BLUE CROSS/BLUE SHIELD | Admitting: Neurology

## 2017-10-10 ENCOUNTER — Other Ambulatory Visit: Payer: Self-pay | Admitting: Neurology

## 2017-11-17 ENCOUNTER — Other Ambulatory Visit: Payer: Self-pay | Admitting: Neurology

## 2017-11-20 ENCOUNTER — Other Ambulatory Visit: Payer: Self-pay | Admitting: Neurology

## 2017-12-17 ENCOUNTER — Other Ambulatory Visit: Payer: Self-pay | Admitting: Neurology

## 2018-01-16 ENCOUNTER — Other Ambulatory Visit: Payer: Self-pay | Admitting: Neurology

## 2018-01-18 ENCOUNTER — Other Ambulatory Visit: Payer: Self-pay | Admitting: Neurology

## 2018-01-25 ENCOUNTER — Telehealth: Payer: Self-pay | Admitting: Neurology

## 2018-01-25 MED ORDER — NORTRIPTYLINE HCL 10 MG PO CAPS: 20.0000 mg | ORAL_CAPSULE | Freq: Every day | ORAL | 3 refills | Status: DC

## 2018-01-25 NOTE — Telephone Encounter (Signed)
I called the patient.  The patient has gained some improvement with 20 mg of nortriptyline for her headaches, when she try to come off the medication it significantly worsen the headache.  She will go up on the medication taking 30 mg at night.  I will get a revisit set up for her.

## 2018-01-25 NOTE — Telephone Encounter (Signed)
Called patient to schedule 2 month FU. 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 Patient stated she only gets work schedule 1 week ahead, so she would like to go ahead and be worked in now.  Gave her 7:30 am FU with Dr Anna Orr in August. She verbalized understanding, appreciation.

## 2018-01-25 NOTE — Addendum Note (Signed)
Addended by: Kathrynn Ducking on: 01/25/2018 01:50 PM   Modules accepted: Orders

## 2018-01-25 NOTE — Telephone Encounter (Signed)
Pt states that nortriptyline (PAMELOR) 10 MG capsule has helped some but pt is still having episodes.  Pt would like a call back to discuss increasing dosage or going to another medication.  Please call

## 2018-02-15 DIAGNOSIS — M4322 Fusion of spine, cervical region: Secondary | ICD-10-CM | POA: Diagnosis not present

## 2018-03-27 ENCOUNTER — Ambulatory Visit: Payer: Self-pay | Admitting: Neurology

## 2018-04-11 ENCOUNTER — Encounter: Payer: Self-pay | Admitting: Neurology

## 2018-04-11 ENCOUNTER — Other Ambulatory Visit: Payer: Self-pay

## 2018-04-11 ENCOUNTER — Ambulatory Visit: Payer: BLUE CROSS/BLUE SHIELD | Admitting: Neurology

## 2018-04-11 VITALS — BP 119/80 | HR 92 | Ht 67.0 in | Wt 195.0 lb

## 2018-04-11 DIAGNOSIS — E038 Other specified hypothyroidism: Secondary | ICD-10-CM

## 2018-04-11 DIAGNOSIS — G4489 Other headache syndrome: Secondary | ICD-10-CM | POA: Diagnosis not present

## 2018-04-11 HISTORY — DX: Other headache syndrome: G44.89

## 2018-04-11 MED ORDER — PROPRANOLOL HCL 20 MG PO TABS: ORAL_TABLET | ORAL | 3 refills | Status: DC

## 2018-04-11 NOTE — Progress Notes (Signed)
Reason for visit: Headache  Anna Orr is an 52 y.o. female  History of present illness:  Anna Orr is a 52 year old right-handed white female with a history of an unusual headache syndrome.  The patient has headaches that may occur with exercise, she is able to do some forms of exercise such as walking on the treadmill, doing hand weights, but if she does the elliptical or does more strenuous weights then she will get a headache.  Heat exposure, and weather changes are also activators.  Occasionally, stress will bring on headache.  The headaches may be associated with neck and shoulder pressure going into the head, and she has pressure into the buttocks area.  The patient has some pulsation type sensations with the hearing, but light does not bother her.  She drinks 2 to 3 cups of coffee daily, she does not drink a lot of tea or other caffeinated products.  The patient may go several weeks without a headache if she avoids the activators.  The patient has gained benefit with the nortriptyline, she no longer is missing work due to headache.  She has had fewer headaches and she has been able to get back to some forms of exercise while using the medication.  She is on a 30 mg dose at night.  She comes to this office for an evaluation.  Past Medical History:  Diagnosis Date  . DDD (degenerative disc disease), cervical   . DDD (degenerative disc disease), lumbar   . Hypothyroidism   . Kidney stone     Past Surgical History:  Procedure Laterality Date  . BLOOD PATCH     states cysts on spine were leaking and treated with blood patch  . BREAST ENHANCEMENT SURGERY    . COLONOSCOPY N/A 11/07/2014   Procedure: COLONOSCOPY;  Surgeon: Danie Binder, MD;  Location: [facility];  Service: Endoscopy;  Laterality: N/A;  0830   . NECK SURGERY     2017  . TUBAL LIGATION      Family History  Problem Relation Age of Onset  . Eczema Sister   . Colon cancer Neg Hx   . Allergic rhinitis Neg Hx    . Asthma Neg Hx   . Urticaria Neg Hx     Social history:  reports that she has never smoked. She has never used smokeless tobacco. She reports that she does not drink alcohol or use drugs.    Allergies  Allergen Reactions  . Gabapentin     Had SE, unable to tolerate    Medications:  Prior to Admission medications   Medication Sig Start Date End Date Taking? Authorizing Provider  ibuprofen (ADVIL,MOTRIN) 200 MG tablet Take 200 mg by mouth every 6 (six) hours as needed for fever, mild pain or moderate pain.   Yes [provider]  levothyroxine (LEVOXYL) 50 MCG tablet Take by mouth. 10/01/15  Yes [provider]  levothyroxine (SYNTHROID, LEVOTHROID) 50 MCG tablet Take 50 mcg by mouth daily before breakfast.   Yes [provider]  nortriptyline (PAMELOR) 10 MG capsule Take 2 capsules (20 mg total) by mouth at bedtime. 01/25/18  Yes Kathrynn Ducking, MD  OMEPRAZOLE PO Take 3 tablets by mouth at bedtime.    Yes [provider]    ROS:  Out of a complete 14 system review of symptoms, the patient complains only of the following symptoms, and all other reviewed systems are negative.  Fatigue Headache  Blood pressure 119/80,  pulse 92, height 5\' 7"  (1.702 m), weight 195 lb (88.5 kg), SpO2 97 %.  Physical Exam  General: The patient is alert and cooperative at the time of the examination.  The patient is mildly obese.  Neuromuscular: Range of movement the cervical spine is full.  Skin: No significant peripheral edema is noted.   Neurologic Exam  Mental status: The patient is alert and oriented x 3 at the time of the examination. The patient has apparent normal recent and remote memory, with an apparently normal attention span and concentration ability.   Cranial nerves: Facial symmetry is present. Speech is normal, no aphasia or dysarthria is noted. Extraocular movements are full. Visual fields are full.  Motor: The patient has good strength  in all 4 extremities.  Sensory examination: Soft touch sensation is symmetric on the face, arms, and legs.  Coordination: The patient has good finger-nose-finger and heel-to-shin bilaterally.  Gait and station: The patient has a normal gait. Tandem gait is normal. Romberg is negative. No drift is seen.  Reflexes: Deep tendon reflexes are symmetric.   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 May consider dedicated pituitary imaging and hormonal evaluation if clinically indicated. - No evidence of dural enhancement to suggest intracranial hypotension.  - No acute findings. - No significant change from MRI on 04/01/15.  * MRI scan images were reviewed online. I agree with the written report.    Assessment/Plan:  1.  Headache syndrome, possible migraine  Prior MRI studies on 2 occasions have not shown evidence of dural enhancement that would suggest a low pressure headache.  The patient indicates that she has particular activators such as exercise, weather changes, and heat exposure.  The patient may have an unusual form of migraine.  The nortriptyline has been helpful, she could not tolerate gabapentin previously.  The patient will be placed on propranolol working up to 40 mg twice daily dose.  The patient will follow-up in 5 months, she will call for any dose adjustments.  Some evidence of pituitary hyperplasia was noted on MRI, she will have blood work done today.  Jill Alexanders MD 04/11/2018 8:47 AM  [facility] [address] [place], [place] [address]  Phone [phone] Fax [phone]

## 2018-04-11 NOTE — Patient Instructions (Signed)
We will start propranolol for the headache.     Inderal (propranolol) is a blood pressure medication that is commonly used for migraine headaches. This is a type of beta blocker. The most common side effects include low heart rate, dizziness, fatigue, and increased depression. This medication may worsen asthma. If you believe that you are having side effects on this medication, please contact our office.  

## 2018-04-12 LAB — PROLACTIN: PROLACTIN: 9.8 ng/mL (ref 4.8–23.3)

## 2018-04-12 LAB — TSH: TSH: 4.46 u[IU]/mL (ref 0.450–4.500)

## 2018-06-10 ENCOUNTER — Other Ambulatory Visit: Payer: Self-pay | Admitting: Neurology

## 2018-08-10 DIAGNOSIS — E063 Autoimmune thyroiditis: Secondary | ICD-10-CM | POA: Diagnosis not present

## 2018-08-10 DIAGNOSIS — J329 Chronic sinusitis, unspecified: Secondary | ICD-10-CM | POA: Diagnosis not present

## 2018-08-10 DIAGNOSIS — Z1389 Encounter for screening for other disorder: Secondary | ICD-10-CM | POA: Diagnosis not present

## 2018-08-10 DIAGNOSIS — Z6831 Body mass index (BMI) 31.0-31.9, adult: Secondary | ICD-10-CM | POA: Diagnosis not present

## 2018-08-10 DIAGNOSIS — E669 Obesity, unspecified: Secondary | ICD-10-CM | POA: Diagnosis not present

## 2018-08-13 ENCOUNTER — Other Ambulatory Visit: Payer: Self-pay | Admitting: Neurology

## 2018-08-16 ENCOUNTER — Other Ambulatory Visit: Payer: Self-pay | Admitting: Neurology

## 2018-08-20 ENCOUNTER — Ambulatory Visit (HOSPITAL_COMMUNITY)
Admission: RE | Admit: 2018-08-20 | Discharge: 2018-08-20 | Disposition: A | Discharge status: Home / Self Care | Payer: BLUE CROSS/BLUE SHIELD | Source: Ambulatory Visit | Attending: Internal Medicine | Admitting: Internal Medicine

## 2018-08-20 ENCOUNTER — Other Ambulatory Visit (HOSPITAL_COMMUNITY): Payer: Self-pay | Admitting: Internal Medicine

## 2018-08-20 ENCOUNTER — Other Ambulatory Visit: Payer: Self-pay | Admitting: Internal Medicine

## 2018-08-20 DIAGNOSIS — E669 Obesity, unspecified: Secondary | ICD-10-CM | POA: Diagnosis not present

## 2018-08-20 DIAGNOSIS — R0600 Dyspnea, unspecified: Secondary | ICD-10-CM

## 2018-08-20 DIAGNOSIS — R519 Headache, unspecified: Secondary | ICD-10-CM

## 2018-08-20 DIAGNOSIS — Z6830 Body mass index (BMI) 30.0-30.9, adult: Secondary | ICD-10-CM | POA: Diagnosis not present

## 2018-08-20 DIAGNOSIS — J329 Chronic sinusitis, unspecified: Secondary | ICD-10-CM | POA: Diagnosis not present

## 2018-08-20 DIAGNOSIS — Z1389 Encounter for screening for other disorder: Secondary | ICD-10-CM | POA: Diagnosis not present

## 2018-08-20 DIAGNOSIS — R0602 Shortness of breath: Secondary | ICD-10-CM | POA: Diagnosis not present

## 2018-08-20 DIAGNOSIS — T50905A Adverse effect of unspecified drugs, medicaments and biological substances, initial encounter: Secondary | ICD-10-CM | POA: Diagnosis not present

## 2018-08-20 DIAGNOSIS — R51 Headache: Secondary | ICD-10-CM | POA: Diagnosis not present

## 2018-08-20 DIAGNOSIS — H9201 Otalgia, right ear: Secondary | ICD-10-CM | POA: Diagnosis not present

## 2018-08-20 DIAGNOSIS — H7092 Unspecified mastoiditis, left ear: Secondary | ICD-10-CM | POA: Diagnosis not present

## 2018-08-20 DIAGNOSIS — R5383 Other fatigue: Secondary | ICD-10-CM | POA: Diagnosis not present

## 2018-08-20 DIAGNOSIS — Z0000 Encounter for general adult medical examination without abnormal findings: Secondary | ICD-10-CM | POA: Diagnosis not present

## 2018-08-23 ENCOUNTER — Other Ambulatory Visit: Payer: Self-pay

## 2018-08-23 MED ORDER — NORTRIPTYLINE HCL 10 MG PO CAPS: 20.0000 mg | ORAL_CAPSULE | Freq: Every day | ORAL | 0 refills | Status: DC

## 2018-09-05 DIAGNOSIS — Z780 Asymptomatic menopausal state: Secondary | ICD-10-CM | POA: Diagnosis not present

## 2018-09-05 DIAGNOSIS — Z6830 Body mass index (BMI) 30.0-30.9, adult: Secondary | ICD-10-CM | POA: Diagnosis not present

## 2018-09-12 ENCOUNTER — Ambulatory Visit: Payer: BLUE CROSS/BLUE SHIELD | Admitting: Neurology

## 2018-10-27 ENCOUNTER — Other Ambulatory Visit: Payer: Self-pay | Admitting: Neurology

## 2018-10-31 DIAGNOSIS — Z6822 Body mass index (BMI) 22.0-22.9, adult: Secondary | ICD-10-CM | POA: Diagnosis not present

## 2018-10-31 DIAGNOSIS — J209 Acute bronchitis, unspecified: Secondary | ICD-10-CM | POA: Diagnosis not present

## 2018-11-06 ENCOUNTER — Other Ambulatory Visit: Payer: Self-pay | Admitting: *Deleted

## 2018-11-06 MED ORDER — NORTRIPTYLINE HCL 10 MG PO CAPS: 20.0000 mg | ORAL_CAPSULE | Freq: Every day | ORAL | 0 refills | Status: DC

## 2019-01-23 ENCOUNTER — Other Ambulatory Visit: Payer: Self-pay | Admitting: Neurology

## 2019-03-12 ENCOUNTER — Other Ambulatory Visit: Payer: Self-pay | Admitting: Neurology

## 2019-03-30 ENCOUNTER — Other Ambulatory Visit: Payer: Self-pay | Admitting: Neurology

## 2019-04-03 ENCOUNTER — Encounter: Payer: Self-pay | Admitting: Family Medicine

## 2019-04-03 ENCOUNTER — Ambulatory Visit: Payer: BC Managed Care – PPO | Admitting: Family Medicine

## 2019-04-03 ENCOUNTER — Other Ambulatory Visit: Payer: Self-pay

## 2019-04-03 VITALS — BP 112/65 | HR 66 | Temp 97.8°F | Ht 67.0 in | Wt 212.0 lb

## 2019-04-03 DIAGNOSIS — G4489 Other headache syndrome: Secondary | ICD-10-CM | POA: Diagnosis not present

## 2019-04-03 MED ORDER — PROPRANOLOL HCL 20 MG PO TABS: ORAL_TABLET | ORAL | 3 refills | Status: DC

## 2019-04-03 MED ORDER — NORTRIPTYLINE HCL 10 MG PO CAPS: 30.0000 mg | ORAL_CAPSULE | Freq: Every day | ORAL | 3 refills | Status: DC

## 2019-04-03 NOTE — Patient Instructions (Signed)
Continue nortriptyline 20mg  at bedtime and propranolol 40mg  twice daily  Follow up in 1 year, sooner if needed   General Headache Without Cause A headache is pain or discomfort that is felt around the head or neck area. There are many causes and types of headaches. In some cases, the cause may not be found. Follow these instructions at home: Watch your condition for any changes. Let your doctor know about them. Take these steps to help with your condition: Managing pain      Take over-the-counter and prescription medicines only as told by your doctor.  Lie down in a dark, quiet room when you have a headache.  If told, put ice on your head and neck area: ? Put ice in a plastic bag. ? Place a towel between your skin and the bag. ? Leave the ice on for 20 minutes, 2-3 times per day.  If told, put heat on the affected area. Use the heat source that your doctor recommends, such as a moist heat pack or a heating pad. ? Place a towel between your skin and the heat source. ? Leave the heat on for 20-30 minutes. ? Remove the heat if your skin turns bright red. This is very important if you are unable to feel pain, heat, or cold. You may have a greater risk of getting burned.  Keep lights dim if bright lights bother you or make your headaches worse. Eating and drinking  Eat meals on a regular schedule.  If you drink alcohol: ? Limit how much you use to:  0-1 drink a day for women.  0-2 drinks a day for men. ? Be aware of how much alcohol is in your drink. In the [place] one drink equals one 12 oz bottle of beer (355 mL), one 5 oz glass of wine (148 mL), or one 1 oz glass of hard liquor (44 mL).  Stop drinking caffeine, or reduce how much caffeine you drink. General instructions   Keep a journal to find out if certain things bring on headaches. For example, write down: ? What you eat and drink. ? How much sleep you get. ? Any change to your diet or medicines.  Get a massage or  try other ways to relax.  Limit stress.  Sit up straight. Do not tighten (tense) your muscles.  Do not use any products that contain nicotine or tobacco. This includes cigarettes, e-cigarettes, and chewing tobacco. If you need help quitting, ask your doctor.  Exercise regularly as told by your doctor.  Get enough sleep. This often means 7-9 hours of sleep each night.  Keep all follow-up visits as told by your doctor. This is important. Contact a doctor if:  Your symptoms are not helped by medicine.  You have a headache that feels different than the other headaches.  You feel sick to your stomach (nauseous) or you throw up (vomit).  You have a fever. Get help right away if:  Your headache gets very bad quickly.  Your headache gets worse after a lot of physical activity.  You keep throwing up.  You have a stiff neck.  You have trouble seeing.  You have trouble speaking.  You have pain in the eye or ear.  Your muscles are weak or you lose muscle control.  You lose your balance or have trouble walking.  You feel like you will pass out (faint) or you pass out.  You are mixed up (confused).  You have a seizure. Summary  A headache is pain or discomfort that is felt around the head or neck area.  There are many causes and types of headaches. In some cases, the cause may not be found.  Keep a journal to help find out what causes your headaches. Watch your condition for any changes. Let your doctor know about them.  Contact a doctor if you have a headache that is different from usual, or if your headache is not helped by medicine.  Get help right away if your headache gets very bad, you throw up, you have trouble seeing, you lose your balance, or you have a seizure. This information is not intended to replace advice given to you by your health care provider. Make sure you discuss any questions you have with your health care provider. Document Released: 05/03/2008  Document Revised: 02/12/2018 Document Reviewed: 02/12/2018 Elsevier Patient Education  2020 [employer].

## 2019-04-03 NOTE — Progress Notes (Signed)
PATIENT: Anna Orr DOB: 1966/06/21  REASON FOR VISIT: follow up HISTORY FROM: patient  Chief Complaint  Patient presents with  . Follow-up    5 mon f/u. Husband present. New room. No new concerns at this time.      HISTORY OF PRESENT ILLNESS: Today 04/03/19 Anna Orr is a 53 y.o. female here today for follow up for headaches. She continues nortriptyline 30mg  at bedtime. She is also taking propranolol 40mg  twice daily. She reports doing pretty good. She feels that she may have 2-3 headaches a month. Weather and stress are known triggers. They are not as severe as they used to be. She is able to work and do all ADL's. She tries to exercise 3-4 times a week.    HISTORY: (copied from Dr Jannifer Franklin' note on 04/12/2019)  Ms. Slifka is a 53 year old right-handed white female with a history of an unusual headache syndrome.  The patient has headaches that may occur with exercise, she is able to do some forms of exercise such as walking on the treadmill, doing hand weights, but if she does the elliptical or does more strenuous weights then she will get a headache.  Heat exposure, and weather changes are also activators.  Occasionally, stress will bring on headache.  The headaches may be associated with neck and shoulder pressure going into the head, and she has pressure into the buttocks area.  The patient has some pulsation type sensations with the hearing, but light does not bother her.  She drinks 2 to 3 cups of coffee daily, she does not drink a lot of tea or other caffeinated products.  The patient may go several weeks without a headache if she avoids the activators.  The patient has gained benefit with the nortriptyline, she no longer is missing work due to headache.  She has had fewer headaches and she has been able to get back to some forms of exercise while using the medication.  She is on a 30 mg dose at night.  She comes to this office for an evaluation   REVIEW OF SYSTEMS: Out of a  complete 14 system review of symptoms, the patient complains only of the following symptoms, none and all other reviewed systems are negative.   ALLERGIES: Allergies  Allergen Reactions  . Gabapentin     Had SE, unable to tolerate    HOME MEDICATIONS: Outpatient Medications Prior to Visit  Medication Sig Dispense Refill  . levothyroxine (LEVOXYL) 50 MCG tablet Take by mouth.    . OMEPRAZOLE PO Take 3 tablets by mouth at bedtime.     Marland Kitchen SYNTHROID 75 MCG tablet Take 75 mcg by mouth daily before breakfast.    . nortriptyline (PAMELOR) 10 MG capsule TAKE 2 CAPSULES (20 MG TOTAL) BY MOUTH AT BEDTIME. 180 capsule 0  . propranolol (INDERAL) 20 MG tablet TAKE 2 TABLETS BY MOUTH TWICE A DAY 360 tablet 1  . ibuprofen (ADVIL,MOTRIN) 200 MG tablet Take 200 mg by mouth every 6 (six) hours as needed for fever, mild pain or moderate pain.    Marland Kitchen levothyroxine (SYNTHROID, LEVOTHROID) 50 MCG tablet Take 50 mcg by mouth daily before breakfast.     No facility-administered medications prior to visit.     PAST MEDICAL HISTORY: Past Medical History:  Diagnosis Date  . DDD (degenerative disc disease), cervical   . DDD (degenerative disc disease), lumbar   . Headache syndrome 04/11/2018  . Hypothyroidism   . Kidney stone  PAST SURGICAL HISTORY: Past Surgical History:  Procedure Laterality Date  . BLOOD PATCH     states cysts on spine were leaking and treated with blood patch  . BREAST ENHANCEMENT SURGERY    . COLONOSCOPY N/A 11/07/2014   Procedure: COLONOSCOPY;  Surgeon: Danie Binder, MD;  Location: [facility];  Service: Endoscopy;  Laterality: N/A;  0830   . NECK SURGERY     2017  . TUBAL LIGATION      FAMILY HISTORY: Family History  Problem Relation Age of Onset  . Eczema Sister   . Colon cancer Neg Hx   . Allergic rhinitis Neg Hx   . Asthma Neg Hx   . Urticaria Neg Hx     SOCIAL HISTORY: Social History   Socioeconomic History  . Marital status: Married    Spouse name: Not  on file  . Number of children: Not on file  . Years of education: Not on file  . Highest education level: Not on file  Occupational History  . Occupation: printing    Comment: lifting, pushing  Social Needs  . Financial resource strain: Not on file  . Food insecurity    Worry: Not on file    Inability: Not on file  . Transportation needs    Medical: Not on file    Non-medical: Not on file  Tobacco Use  . Smoking status: Never Smoker  . Smokeless tobacco: Never Used  Substance and Sexual Activity  . Alcohol use: No    Alcohol/week: 0.0 standard drinks    Comment: 1 mixed drink a day   . Drug use: No  . Sexual activity: Not on file  Lifestyle  . Physical activity    Days per week: Not on file    Minutes per session: Not on file  . Stress: Not on file  Relationships  . Social Herbalist on phone: Not on file    Gets together: Not on file    Attends religious service: Not on file    Active member of club or organization: Not on file    Attends meetings of clubs or organizations: Not on file    Relationship status: Not on file  . Intimate partner violence    Fear of current or ex partner: Not on file    Emotionally abused: Not on file    Physically abused: Not on file    Forced sexual activity: Not on file  Other Topics Concern  . Not on file  Social History Narrative  . Not on file      PHYSICAL EXAM  Vitals:   04/03/19 1522  BP: 112/65  Pulse: 66  Temp: 97.8 F (36.6 C)  TempSrc: Oral  Weight: 212 lb (96.2 kg)  Height: 5\' 7"  (1.702 m)   Body mass index is 33.2 kg/m.  Generalized: Well developed, in no acute distress  Cardiology: normal rate and rhythm, no murmur noted Neurological examination  Mentation: Alert oriented to time, place, history taking. Follows all commands speech and language fluent Cranial nerve II-XII: Pupils were equal round reactive to light. Extraocular movements were full, visual field were full on confrontational test.  Facial sensation and strength were normal. Uvula tongue midline. Head turning and shoulder shrug  were normal and symmetric. Motor: The motor testing reveals 5 over 5 strength of all 4 extremities. Good symmetric motor tone is noted throughout.  Coordination: Cerebellar testing reveals good finger-nose-finger and heel-to-shin bilaterally.  Gait and station: Gait  is normal.   DIAGNOSTIC DATA (LABS, IMAGING, TESTING) - I reviewed patient records, labs, notes, testing and imaging myself where available.  No flowsheet data found.   Lab Results  Component Value Date   WBC 7.6 03/14/2016   HGB 14.5 03/14/2016   HCT 43.8 03/14/2016   MCV 92.8 03/14/2016   PLT 234 03/14/2016      Component Value Date/Time   NA 139 03/14/2016 1420   K 3.9 03/14/2016 1420   CL 107 03/14/2016 1420   CO2 25 03/14/2016 1420   GLUCOSE 87 03/14/2016 1420   BUN 16 03/14/2016 1420   CREATININE 0.78 03/14/2016 1420   CALCIUM 8.9 03/14/2016 1420   PROT 6.8 05/24/2015 1905   ALBUMIN 3.9 05/24/2015 1905   AST 13 (L) 05/24/2015 1905   ALT 12 (L) 05/24/2015 1905   ALKPHOS 66 05/24/2015 1905   BILITOT 0.6 05/24/2015 1905   GFRNONAA >60 03/14/2016 1420   GFRAA >60 03/14/2016 1420   No results found for: CHOL, HDL, LDLCALC, LDLDIRECT, TRIG, CHOLHDL No results found for: HGBA1C No results found for: VITAMINB12 Lab Results  Component Value Date   TSH 4.460 04/11/2018       ASSESSMENT AND PLAN 53 y.o. year old female  has a past medical history of DDD (degenerative disc disease), cervical, DDD (degenerative disc disease), lumbar, Headache syndrome (04/11/2018), Hypothyroidism, and Kidney stone. here with     ICD-10-CM   1. Headache syndrome  G44.89 nortriptyline (PAMELOR) 10 MG capsule    propranolol (INDERAL) 20 MG tablet    She reports significant reduction in frequency and intensity of headaches.  She does not have any migrainous features.  She is tolerating propranolol and nortriptyline without obvious  adverse effects.  We will continue propranolol 40 mg twice daily as well as nortriptyline 30 mg at night.  She was advised to avoid triggers medications have been refilled for 1 year.  She will follow-up with [place] in 1 year.  She verbalizes an agreement with this plan.   No orders of the defined types were placed in this encounter.    Meds ordered this encounter  Medications  . nortriptyline (PAMELOR) 10 MG capsule    Sig: Take 3 capsules (30 mg total) by mouth at bedtime.    Dispense:  270 capsule    Refill:  3    Order Specific Question:   Supervising Provider    Answer:   Melvenia Beam [id number]  . propranolol (INDERAL) 20 MG tablet    Sig: TAKE 2 TABLETS BY MOUTH TWICE A DAY    Dispense:  360 tablet    Refill:  3    Order Specific Question:   Supervising Provider    Answer:   Melvenia Beam [id number]      I spent 15 minutes with the patient. 50% of this time was spent counseling and educating patient on plan of care and medications.    Debbora Presto, FNP-C 04/03/2019, 3:48 PM [facility] [address], [place], [place] [address] [phone]

## 2019-04-03 NOTE — Progress Notes (Signed)
I have read the note, and I agree with the clinical assessment and plan.  Anna Orr   

## 2019-05-13 ENCOUNTER — Other Ambulatory Visit: Payer: Self-pay | Admitting: Neurology

## 2019-05-13 DIAGNOSIS — G4489 Other headache syndrome: Secondary | ICD-10-CM

## 2019-09-04 ENCOUNTER — Other Ambulatory Visit: Payer: Self-pay | Admitting: Family Medicine

## 2019-09-04 DIAGNOSIS — G4489 Other headache syndrome: Secondary | ICD-10-CM

## 2019-09-04 MED ORDER — NORTRIPTYLINE HCL 10 MG PO CAPS: 30.0000 mg | ORAL_CAPSULE | Freq: Every day | ORAL | 3 refills | Status: DC

## 2019-10-21 ENCOUNTER — Encounter: Payer: Self-pay | Admitting: Gastroenterology

## 2019-10-31 DIAGNOSIS — M7071 Other bursitis of hip, right hip: Secondary | ICD-10-CM | POA: Diagnosis not present

## 2019-10-31 DIAGNOSIS — E063 Autoimmune thyroiditis: Secondary | ICD-10-CM | POA: Diagnosis not present

## 2019-10-31 DIAGNOSIS — G8929 Other chronic pain: Secondary | ICD-10-CM | POA: Diagnosis not present

## 2019-10-31 DIAGNOSIS — E6609 Other obesity due to excess calories: Secondary | ICD-10-CM | POA: Diagnosis not present

## 2019-10-31 DIAGNOSIS — Z1389 Encounter for screening for other disorder: Secondary | ICD-10-CM | POA: Diagnosis not present

## 2019-10-31 DIAGNOSIS — Z6832 Body mass index (BMI) 32.0-32.9, adult: Secondary | ICD-10-CM | POA: Diagnosis not present

## 2019-10-31 DIAGNOSIS — D171 Benign lipomatous neoplasm of skin and subcutaneous tissue of trunk: Secondary | ICD-10-CM | POA: Diagnosis not present

## 2020-05-25 DIAGNOSIS — L719 Rosacea, unspecified: Secondary | ICD-10-CM | POA: Diagnosis not present

## 2020-05-25 DIAGNOSIS — E6609 Other obesity due to excess calories: Secondary | ICD-10-CM | POA: Diagnosis not present

## 2020-05-25 DIAGNOSIS — Z6831 Body mass index (BMI) 31.0-31.9, adult: Secondary | ICD-10-CM | POA: Diagnosis not present

## 2020-05-29 ENCOUNTER — Other Ambulatory Visit: Payer: Self-pay

## 2020-05-29 DIAGNOSIS — Z20822 Contact with and (suspected) exposure to covid-19: Secondary | ICD-10-CM

## 2020-05-30 LAB — NOVEL CORONAVIRUS, NAA: SARS-CoV-2, NAA: NOT DETECTED

## 2020-05-30 LAB — SARS-COV-2, NAA 2 DAY TAT

## 2020-06-11 ENCOUNTER — Telehealth: Payer: Self-pay | Admitting: Family Medicine

## 2020-06-11 DIAGNOSIS — G4489 Other headache syndrome: Secondary | ICD-10-CM

## 2020-06-11 MED ORDER — PROPRANOLOL HCL 20 MG PO TABS: ORAL_TABLET | ORAL | 1 refills | Status: DC

## 2020-06-11 NOTE — Telephone Encounter (Signed)
Pt has scheduled her annual f/u for 09-10-20.  Pt is asking for a refill on her propranolol (INDERAL) 20 MG tablet to [id number]

## 2020-06-11 NOTE — Addendum Note (Signed)
Addended by: Brandon Melnick on: 06/11/2020 10:46 AM   Modules accepted: Orders

## 2020-07-09 DIAGNOSIS — J22 Unspecified acute lower respiratory infection: Secondary | ICD-10-CM | POA: Diagnosis not present

## 2020-07-09 DIAGNOSIS — R070 Pain in throat: Secondary | ICD-10-CM | POA: Diagnosis not present

## 2020-07-09 DIAGNOSIS — Z681 Body mass index (BMI) 19 or less, adult: Secondary | ICD-10-CM | POA: Diagnosis not present

## 2020-08-25 DIAGNOSIS — U071 COVID-19: Secondary | ICD-10-CM | POA: Diagnosis not present

## 2020-09-03 DIAGNOSIS — Z1151 Encounter for screening for human papillomavirus (HPV): Secondary | ICD-10-CM | POA: Diagnosis not present

## 2020-09-03 DIAGNOSIS — Z01419 Encounter for gynecological examination (general) (routine) without abnormal findings: Secondary | ICD-10-CM | POA: Diagnosis not present

## 2020-09-03 DIAGNOSIS — Z6831 Body mass index (BMI) 31.0-31.9, adult: Secondary | ICD-10-CM | POA: Diagnosis not present

## 2020-09-10 ENCOUNTER — Ambulatory Visit: Payer: BC Managed Care – PPO | Admitting: Family Medicine

## 2020-09-10 ENCOUNTER — Encounter: Payer: Self-pay | Admitting: Family Medicine

## 2020-09-10 DIAGNOSIS — G4489 Other headache syndrome: Secondary | ICD-10-CM | POA: Diagnosis not present

## 2020-09-10 MED ORDER — PROPRANOLOL HCL 20 MG PO TABS
ORAL_TABLET | ORAL | 3 refills | Status: DC
Start: 2020-09-10 — End: 2021-09-10

## 2020-09-10 MED ORDER — NORTRIPTYLINE HCL 10 MG PO CAPS: 30.0000 mg | ORAL_CAPSULE | Freq: Every day | ORAL | 3 refills | Status: DC

## 2020-09-10 NOTE — Progress Notes (Signed)
PATIENT: Anna Orr DOB: 1965-10-13  REASON FOR VISIT: follow up HISTORY FROM: patient  Chief Complaint  Patient presents with  . Follow-up    RM 1 with husband (Anna Orr) Pt is well, has some episodes every once in a while      HISTORY OF PRESENT ILLNESS:  09/10/20 ALL: She continues to do well. She continues nortriptyline 30mg  at bedtime and propranolol 40mg  BID. Tension style pressure all over her head. She feels that she can't hear as well but no photo or phonophobia. No nausea or vomiting. She usually takes ibuprofen which helps. She knows that she doesn't drink as much water as she should. She continues to follow up closely with PCP. Now on Synthroid 30mcg daily.    04/03/2019 ALL: Anna Orr is a 55 y.o. female here today for follow up for headaches. She continues nortriptyline 30mg  at bedtime. She is also taking propranolol 40mg  twice daily. She reports doing pretty good. She feels that she may have 2-3 headaches a month. Weather and stress are known triggers. They are not as severe as they used to be. She is able to work and do all ADL's. She tries to exercise 3-4 times a week.    HISTORY: (copied from Dr Jannifer Franklin' note on 04/12/2019)  Anna Orr is a 55 year old right-handed white female with a history of an unusual headache syndrome.  The patient has headaches that may occur with exercise, she is able to do some forms of exercise such as walking on the treadmill, doing hand weights, but if she does the elliptical or does more strenuous weights then she will get a headache.  Heat exposure, and weather changes are also activators.  Occasionally, stress will bring on headache.  The headaches may be associated with neck and shoulder pressure going into the head, and she has pressure into the buttocks area.  The patient has some pulsation type sensations with the hearing, but light does not bother her.  She drinks 2 to 3 cups of coffee daily, she does not drink a lot of tea or  other caffeinated products.  The patient may go several weeks without a headache if she avoids the activators.  The patient has gained benefit with the nortriptyline, she no longer is missing work due to headache.  She has had fewer headaches and she has been able to get back to some forms of exercise while using the medication.  She is on a 30 mg dose at night.  She comes to this office for an evaluation   REVIEW OF SYSTEMS: Out of a complete 14 system review of symptoms, the patient complains only of the following symptoms, none and all other reviewed systems are negative.   ALLERGIES: Allergies  Allergen Reactions  . Gabapentin     Had SE, unable to tolerate    HOME MEDICATIONS: Outpatient Medications Prior to Visit  Medication Sig Dispense Refill  . OMEPRAZOLE PO Take 3 tablets by mouth at bedtime.     Marland Kitchen SYNTHROID 75 MCG tablet Take 75 mcg by mouth daily before breakfast.    . nortriptyline (PAMELOR) 10 MG capsule Take 3 capsules (30 mg total) by mouth at bedtime. 270 capsule 3  . propranolol (INDERAL) 20 MG tablet TAKE 2 TABLETS BY MOUTH TWICE A DAY 360 tablet 1  . ibuprofen (ADVIL,MOTRIN) 200 MG tablet Take 200 mg by mouth every 6 (six) hours as needed for fever, mild pain or moderate pain.    Marland Kitchen levothyroxine (LEVOXYL)  50 MCG tablet Take by mouth.     No facility-administered medications prior to visit.    PAST MEDICAL HISTORY: Past Medical History:  Diagnosis Date  . DDD (degenerative disc disease), cervical   . DDD (degenerative disc disease), lumbar   . Headache syndrome 04/11/2018  . Hypothyroidism   . Kidney stone     PAST SURGICAL HISTORY: Past Surgical History:  Procedure Laterality Date  . BLOOD PATCH     states cysts on spine were leaking and treated with blood patch  . BREAST ENHANCEMENT SURGERY    . COLONOSCOPY N/A 11/07/2014   Procedure: COLONOSCOPY;  Surgeon: Danie Binder, MD;  [facility];  Service: Endoscopy;  Laterality: N/A;  0830   . NECK  SURGERY     2017  . TUBAL LIGATION      FAMILY HISTORY: Family History  Problem Relation Age of Onset  . Eczema Sister   . Colon cancer Neg Hx   . Allergic rhinitis Neg Hx   . Asthma Neg Hx   . Urticaria Neg Hx     SOCIAL HISTORY: Social History   Socioeconomic History  . Marital status: Married    Spouse name: Not on file  . Number of children: Not on file  . Years of education: Not on file  . Highest education level: Not on file  Occupational History  . Occupation: printing    Comment: lifting, pushing  Tobacco Use  . Smoking status: Never Smoker  . Smokeless tobacco: Never Used  Substance and Sexual Activity  . Alcohol use: No    Alcohol/week: 0.0 standard drinks    Comment: 1 mixed drink a day   . Drug use: No  . Sexual activity: Not on file  Other Topics Concern  . Not on file  Social History Narrative  . Not on file   Social Determinants of Health   Financial Resource Strain: Not on file  Food Insecurity: Not on file  Transportation Needs: Not on file  Physical Activity: Not on file  Stress: Not on file  Social Connections: Not on file  Intimate Partner Violence: Not on file      PHYSICAL EXAM  Vitals:   09/10/20 1102  BP: 101/67  Pulse: (!) 57  Weight: 205 lb (93 kg)  Height: 5\' 7"  (1.702 m)   Body mass index is 32.11 kg/m.  Generalized: Well developed, in no acute distress  Cardiology: normal rate and rhythm, no murmur noted Neurological examination  Mentation: Alert oriented to time, place, history taking. Follows all commands speech and language fluent Cranial nerve II-XII: Pupils were equal round reactive to light. Extraocular movements were full, visual field were full on confrontational test. Facial sensation and strength were normal. Head turning and shoulder shrug  were normal and symmetric. Motor: The motor testing reveals 5 over 5 strength of all 4 extremities. Good symmetric motor tone is noted throughout.  Gait and station:  Gait is normal.   DIAGNOSTIC DATA (LABS, IMAGING, TESTING) - I reviewed patient records, labs, notes, testing and imaging myself where available.  No flowsheet data found.   Lab Results  Component Value Date   WBC 7.6 03/14/2016   HGB 14.5 03/14/2016   HCT 43.8 03/14/2016   MCV 92.8 03/14/2016   PLT 234 03/14/2016      Component Value Date/Time   NA 139 03/14/2016 1420   K 3.9 03/14/2016 1420   CL 107 03/14/2016 1420   CO2 25 03/14/2016 1420  GLUCOSE 87 03/14/2016 1420   BUN 16 03/14/2016 1420   CREATININE 0.78 03/14/2016 1420   CALCIUM 8.9 03/14/2016 1420   PROT 6.8 05/24/2015 1905   ALBUMIN 3.9 05/24/2015 1905   AST 13 (L) 05/24/2015 1905   ALT 12 (L) 05/24/2015 1905   ALKPHOS 66 05/24/2015 1905   BILITOT 0.6 05/24/2015 1905   GFRNONAA >60 03/14/2016 1420   GFRAA >60 03/14/2016 1420   No results found for: CHOL, HDL, LDLCALC, LDLDIRECT, TRIG, CHOLHDL No results found for: HGBA1C No results found for: VITAMINB12 Lab Results  Component Value Date   TSH 4.460 04/11/2018       ASSESSMENT AND PLAN 55 y.o. year old female  has a past medical history of DDD (degenerative disc disease), cervical, DDD (degenerative disc disease), lumbar, Headache syndrome (04/11/2018), Hypothyroidism, and Kidney stone. here with     ICD-10-CM   1. Headache syndrome  G44.89 nortriptyline (PAMELOR) 10 MG capsule    propranolol (INDERAL) 20 MG tablet     She continues to note benefit of headache preventatives.  She does not have any migrainous features.  She is tolerating propranolol and nortriptyline without obvious adverse effects.  We will continue propranolol 40 mg twice daily as well as nortriptyline 30 mg at night.  She was advised to avoid triggers medications have been refilled for 1 year.  She will follow-up with [place] in 1 year.  She verbalizes an agreement with this plan.   No orders of the defined types were placed in this encounter.    Meds ordered this encounter   Medications  . nortriptyline (PAMELOR) 10 MG capsule    Sig: Take 3 capsules (30 mg total) by mouth at bedtime.    Dispense:  270 capsule    Refill:  3    Order Specific Question:   Supervising Provider    Answer:   Melvenia Beam [id number]  . propranolol (INDERAL) 20 MG tablet    Sig: TAKE 2 TABLETS BY MOUTH TWICE A DAY    Dispense:  360 tablet    Refill:  3    Order Specific Question:   Supervising Provider    Answer:   Melvenia Beam [id number]      I spent 15 minutes with the patient. 50% of this time was spent counseling and educating patient on plan of care and medications.    Debbora Presto, FNP-C 09/10/2020, 11:46 AM [facility] [address], [place], [place] [address] [phone]

## 2020-09-10 NOTE — Progress Notes (Signed)
I have read the note, and I agree with the clinical assessment and plan.  Fynlee Rowlands K Sourish Allender   

## 2020-09-10 NOTE — Patient Instructions (Signed)
Below is our plan:  We will continue nortriptyline 30mg  daily and propranolol 40mg  twice. Try Aleve with benadryl for weather related headaches.   Please make sure you are staying well hydrated. I recommend 50-60 ounces daily. Well balanced diet and regular exercise encouraged.    Please continue follow up with care team as directed.   Follow up with me in 1 year   You may receive a survey regarding today's visit. I encourage you to leave honest feed back as I do use this information to improve patient care. Thank you for seeing me today!      Tension Headache, Adult A tension headache is a feeling of pain, pressure, or aching in the head. It is often felt over the front and sides of the head. Tension headaches can last from 30 minutes to several days. What are the causes? The cause of this condition is not known. Sometimes, tension headaches are brought on by stress, worry (anxiety), or depression. Other things that may set them off include:  Alcohol.  Too much caffeine or caffeine withdrawal.  Colds, flu, or sinus infections.  Dental problems. This can include clenching your teeth.  Being tired.  Holding your head and neck in the same position for a long time, such as while using a computer.  Smoking.  Arthritis in the neck. What are the signs or symptoms?  Feeling pressure around the head.  A dull ache in the head.  Pain over the front and sides of the head.  Feeling sore or tender in the muscles of the head, neck, and shoulders. How is this treated? This condition may be treated with lifestyle changes and with medicines that help relieve symptoms. Follow these instructions at home: Managing pain  Take over-the-counter and prescription medicines only as told by your doctor.  When you have a headache, lie down in a dark, quiet room.  If told, put ice on your head and neck. To do this: ? Put ice in a plastic bag. ? Place a towel between your skin and the  bag. ? Leave the ice on for 20 minutes, 2-3 times a day. ? Take off the ice if your skin turns bright red. This is very important. If you cannot feel pain, heat, or cold, you have a greater risk of damage to the area.  If told, put heat on the back of your neck. Do this as often as told by your doctor. Use the heat source that your doctor recommends, such as a moist heat pack or a heating pad. ? Place a towel between your skin and the heat source. ? Leave the heat on for 20-30 minutes. ? Take off the heat if your skin turns bright red. This is very important. If you cannot feel pain, heat, or cold, you have a greater risk of getting burned. Eating and drinking  Eat meals on a regular schedule.  If you drink alcohol: ? Limit how much you have to:  0-1 drink a day for women who are not pregnant.  0-2 drinks a day for men. ? Know how much alcohol is in your drink. In the [place] one drink equals one 12 oz bottle of beer (355 mL), one 5 oz glass of wine (148 mL), or one 1 oz glass of hard liquor (44 mL).  Drink enough fluid to keep your pee (urine) pale yellow.  Do not use a lot of caffeine, or stop using caffeine. Lifestyle  Get 7-9 hours of sleep  each night. Or get the amount of sleep that your doctor tells you to.  At bedtime, keep computers, phones, and tablets out of your room.  Find ways to lessen your stress. This may include: ? Exercise. ? Deep breathing. ? Yoga. ? Listening to music. ? Thinking positive thoughts.  Sit up straight. Try to relax your muscles.  Do not smoke or use any products that contain nicotine or tobacco. If you need help quitting, ask your doctor. General instructions  Avoid things that can bring on headaches. Keep a headache journal to see what may bring on headaches. For example, write down: ? What you eat and drink. ? How much sleep you get. ? Any change to your diet or medicines.  Keep all follow-up visits.   Contact a doctor if:  Your  headache does not get better.  Your headache comes back.  You have a headache, and sounds, light, or smells bother you.  You feel like you may vomit, or you vomit.  Your stomach hurts. Get help right away if:  You all of a sudden get a very bad headache with any of these things: ? A stiff neck. ? Feeling like you may vomit. ? Vomiting. ? Feeling mixed up (confused). ? Feeling weak in one part or one side of your body. ? Having trouble seeing or speaking, or both. ? Feeling short of breath. ? A rash. ? Feeling very sleepy. ? Pain in your eye or ear. ? Trouble walking or balancing. ? Feeling like you will faint, or you faint. Summary  A tension headache is pain, pressure, or aching in your head.  Tension headaches can last from 30 minutes to several days.  Lifestyle changes and medicines may help relieve pain. This information is not intended to replace advice given to you by your health care provider. Make sure you discuss any questions you have with your health care provider. Document Revised: 04/23/2020 Document Reviewed: 04/23/2020 Elsevier Patient Education  2021 [employer].

## 2020-12-28 ENCOUNTER — Encounter: Payer: Self-pay | Admitting: Family Medicine

## 2021-07-08 DIAGNOSIS — L821 Other seborrheic keratosis: Secondary | ICD-10-CM | POA: Diagnosis not present

## 2021-09-10 NOTE — Progress Notes (Signed)
PATIENT: Anna Orr DOB: [birth year]  REASON FOR VISIT: follow up HISTORY FROM: patient  Chief Complaint  Patient presents with   Follow-up    Pt with husband, rm 65. Overall states things are about the same. She continues to get headaches. Sometimes at least 1x a wk but up to 2-3 times a week. This has increasingly become worse. She describes as pressure and husband states hard for her to move.      HISTORY OF PRESENT ILLNESS: 09/13/21 ALL:  Anna Orr returns for follow up for headaches. She continues nortriptyline 30mg  QHS and propranolol 40mg  BID. She reports that headaches have been fairly well managed until recently. She is going through a stressful time and reports more grieving. He husband is with her today and states they experienced an event years ago that has resulted in more stress in the home. This is when headaches first presented. She was doing better for a period of time while going to church. Recently, this event is causing more stress for her and correlates with worsening headaches. She was seeing a counselor but did not feel she meshed well with them. She has called a new counselor and waiting to get an appt. She describes tension style headaches. No migrainous features. She admits that she does not drink much water. She continues to follow up with PCP regularly. She would like to stop nortriptyline and propranolol in the future if headaches improve.   09/10/2020 ALL: She continues to do well. She continues nortriptyline 30mg  at bedtime and propranolol 40mg  BID. Tension style pressure all over her head. She feels that she can't hear as well but no photo or phonophobia. No nausea or vomiting. She usually takes ibuprofen which helps. She knows that she doesn't drink as much water as she should. She continues to follow up closely with PCP. Now on Synthroid 31mcg daily.   04/03/2019 ALL: Anna Orr is a 56 y.o. female here today for follow up for headaches. She continues  nortriptyline 30mg  at bedtime. She is also taking propranolol 40mg  twice daily. She reports doing pretty good. She feels that she may have 2-3 headaches a month. Weather and stress are known triggers. They are not as severe as they used to be. She is able to work and do all ADL's. She tries to exercise 3-4 times a week.   HISTORY: (copied from Dr Jannifer Franklin' note on 04/12/2019)  Ms. Hundal is a 56 year old right-handed white female with a history of an unusual headache syndrome.  The patient has headaches that may occur with exercise, she is able to do some forms of exercise such as walking on the treadmill, doing hand weights, but if she does the elliptical or does more strenuous weights then she will get a headache.  Heat exposure, and weather changes are also activators.  Occasionally, stress will bring on headache.  The headaches may be associated with neck and shoulder pressure going into the head, and she has pressure into the buttocks area.  The patient has some pulsation type sensations with the hearing, but light does not bother her.  She drinks 2 to 3 cups of coffee daily, she does not drink a lot of tea or other caffeinated products.  The patient may go several weeks without a headache if she avoids the activators.  The patient has gained benefit with the nortriptyline, she no longer is missing work due to headache.  She has had fewer headaches and she has been able to get  back to some forms of exercise while using the medication.  She is on a 30 mg dose at night.  She comes to this office for an evaluation   REVIEW OF SYSTEMS: Out of a complete 14 system review of symptoms, the patient complains only of the following symptoms, stress, grief, and all other reviewed systems are negative.   ALLERGIES: Allergies  Allergen Reactions   Gabapentin     Had SE, unable to tolerate    HOME MEDICATIONS: Outpatient Medications Prior to Visit  Medication Sig Dispense Refill   OMEPRAZOLE PO Take 3 tablets  by mouth at bedtime.      SYNTHROID 75 MCG tablet Take 75 mcg by mouth daily before breakfast.     nortriptyline (PAMELOR) 10 MG capsule Take 3 capsules (30 mg total) by mouth at bedtime. 270 capsule 3   propranolol (INDERAL) 20 MG tablet TAKE 2 TABLETS BY MOUTH TWICE A DAY 360 tablet 3   No facility-administered medications prior to visit.    PAST MEDICAL HISTORY: Past Medical History:  Diagnosis Date   DDD (degenerative disc disease), cervical    DDD (degenerative disc disease), lumbar    Headache syndrome 04/11/2018   Hypothyroidism    Kidney stone     PAST SURGICAL HISTORY: Past Surgical History:  Procedure Laterality Date   BLOOD PATCH     states cysts on spine were leaking and treated with blood patch   BREAST ENHANCEMENT SURGERY     COLONOSCOPY N/A 11/07/2014   Procedure: COLONOSCOPY;  Surgeon: Danie Binder, MD;  Location: [facility];  Service: Endoscopy;  Laterality: N/A;  0830    NECK SURGERY     2017   TUBAL LIGATION      FAMILY HISTORY: Family History  Problem Relation Age of Onset   Eczema Sister    Colon cancer Neg Hx    Allergic rhinitis Neg Hx    Asthma Neg Hx    Urticaria Neg Hx     SOCIAL HISTORY: Social History   Socioeconomic History   Marital status: Married    Spouse name: Not on file   Number of children: Not on file   Years of education: Not on file   Highest education level: Not on file  Occupational History   Occupation: printing    Comment: lifting, pushing  Tobacco Use   Smoking status: Never   Smokeless tobacco: Never  Substance and Sexual Activity   Alcohol use: No    Alcohol/week: 0.0 standard drinks    Comment: 1 mixed drink a day    Drug use: No   Sexual activity: Not on file  Other Topics Concern   Not on file  Social History Narrative   ** Merged History Encounter **       Social Determinants of Health   Financial Resource Strain: Not on file  Food Insecurity: Not on file  Transportation Needs: Not on file   Physical Activity: Not on file  Stress: Not on file  Social Connections: Not on file  Intimate Partner Violence: Not on file      PHYSICAL EXAM  Vitals:   09/13/21 1045  BP: 116/73  Pulse: (!) 59  Weight: 200 lb (90.7 kg)  Height: 5\' 7"  (1.702 m)    Body mass index is 31.32 kg/m.  Generalized: Well developed, in no acute distress  Cardiology: normal rate and rhythm, no murmur noted Neurological examination  Mentation: Alert oriented to time, place, history taking. Follows all commands  speech and language fluent Cranial nerve II-XII: Pupils were equal round reactive to light. Extraocular movements were full, visual field were full on confrontational test. Facial sensation and strength were normal. Head turning and shoulder shrug  were normal and symmetric. Motor: The motor testing reveals 5 over 5 strength of all 4 extremities. Good symmetric motor tone is noted throughout.  Gait and station: Gait is normal.   DIAGNOSTIC DATA (LABS, IMAGING, TESTING) - I reviewed patient records, labs, notes, testing and imaging myself where available.  No flowsheet data found.   Lab Results  Component Value Date   WBC 7.6 03/14/2016   HGB 14.5 03/14/2016   HCT 43.8 03/14/2016   MCV 92.8 03/14/2016   PLT 234 03/14/2016      Component Value Date/Time   NA 139 03/14/2016 1420   K 3.9 03/14/2016 1420   CL 107 03/14/2016 1420   CO2 25 03/14/2016 1420   GLUCOSE 87 03/14/2016 1420   BUN 16 03/14/2016 1420   CREATININE 0.78 03/14/2016 1420   CALCIUM 8.9 03/14/2016 1420   PROT 6.8 05/24/2015 1905   ALBUMIN 3.9 05/24/2015 1905   AST 13 (L) 05/24/2015 1905   ALT 12 (L) 05/24/2015 1905   ALKPHOS 66 05/24/2015 1905   BILITOT 0.6 05/24/2015 1905   GFRNONAA >60 03/14/2016 1420   GFRAA >60 03/14/2016 1420   No results found for: CHOL, HDL, LDLCALC, LDLDIRECT, TRIG, CHOLHDL No results found for: HGBA1C No results found for: VITAMINB12 Lab Results  Component Value Date   TSH 4.460  04/11/2018       ASSESSMENT AND PLAN 56 y.o. year old female  has a past medical history of DDD (degenerative disc disease), cervical, DDD (degenerative disc disease), lumbar, Headache syndrome (04/11/2018), Hypothyroidism, and Kidney stone. here with     ICD-10-CM   1. Headache syndrome  G44.89 nortriptyline (PAMELOR) 10 MG capsule    propranolol (INDERAL) 20 MG tablet       She reports that headaches were previously well managed but seem to be worse with increased stressors. She is tolerating propranolol and nortriptyline without obvious adverse effects.  We will continue propranolol 40 mg twice daily as well as nortriptyline 30 mg at night. I have encouraged her to continuing reaching out to her counselor. If headaches improve with therapy, we could consider weaning nortriptyline and propranolol per her request. I have encouraged her to increase water intake and avoid known triggers. She will continue OTC analgesics for abortive therapy but advised against regular use. I will plan to have her followed by Dr Billey Gosling in [place]. She will follow-up with me in 6 months.  She verbalizes an agreement with this plan.   No orders of the defined types were placed in this encounter.    Meds ordered this encounter  Medications   nortriptyline (PAMELOR) 10 MG capsule    Sig: Take 3 capsules (30 mg total) by mouth at bedtime.    Dispense:  270 capsule    Refill:  3    Order Specific Question:   Supervising Provider    Answer:   Melvenia Beam [[id number]]   propranolol (INDERAL) 20 MG tablet    Sig: TAKE 2 TABLETS BY MOUTH TWICE A DAY    Dispense:  360 tablet    Refill:  3    Order Specific Question:   Supervising Provider    Answer:   Melvenia Beam [[id number]]      Stepen Prins, FNP-C 09/13/2021, 11:23 AM [facility] [address] [place], [place] [address] [phone]

## 2021-09-10 NOTE — Patient Instructions (Signed)
Below is our plan:  We will continue nortriptyline 30mg  at bedtime and propranolol 40mg  twice daily   Please make sure you are staying well hydrated. I recommend 50-60 ounces daily. Well balanced diet and regular exercise encouraged. Consistent sleep schedule with 6-8 hours recommended.   Please continue follow up with care team as directed.   Follow up with me in 1 year   You may receive a survey regarding today's visit. I encourage you to leave honest feed back as I do use this information to improve patient care. Thank you for seeing me today!

## 2021-09-13 ENCOUNTER — Ambulatory Visit: Payer: BC Managed Care – PPO | Admitting: Family Medicine

## 2021-09-13 ENCOUNTER — Encounter: Payer: Self-pay | Admitting: Family Medicine

## 2021-09-13 DIAGNOSIS — G4489 Other headache syndrome: Secondary | ICD-10-CM

## 2021-09-13 MED ORDER — NORTRIPTYLINE HCL 10 MG PO CAPS: 30.0000 mg | ORAL_CAPSULE | Freq: Every day | ORAL | 3 refills | Status: DC

## 2021-09-13 MED ORDER — PROPRANOLOL HCL 20 MG PO TABS: ORAL_TABLET | ORAL | 3 refills | Status: DC

## 2021-09-29 DIAGNOSIS — F39 Unspecified mood [affective] disorder: Secondary | ICD-10-CM | POA: Diagnosis not present

## 2021-10-04 DIAGNOSIS — F39 Unspecified mood [affective] disorder: Secondary | ICD-10-CM | POA: Diagnosis not present

## 2021-10-13 DIAGNOSIS — F39 Unspecified mood [affective] disorder: Secondary | ICD-10-CM | POA: Diagnosis not present

## 2021-10-27 DIAGNOSIS — F39 Unspecified mood [affective] disorder: Secondary | ICD-10-CM | POA: Diagnosis not present

## 2021-11-02 DIAGNOSIS — F39 Unspecified mood [affective] disorder: Secondary | ICD-10-CM | POA: Diagnosis not present

## 2021-11-09 DIAGNOSIS — E6609 Other obesity due to excess calories: Secondary | ICD-10-CM | POA: Diagnosis not present

## 2021-11-09 DIAGNOSIS — Z0000 Encounter for general adult medical examination without abnormal findings: Secondary | ICD-10-CM | POA: Diagnosis not present

## 2021-11-09 DIAGNOSIS — E063 Autoimmune thyroiditis: Secondary | ICD-10-CM | POA: Diagnosis not present

## 2021-11-09 DIAGNOSIS — Z6831 Body mass index (BMI) 31.0-31.9, adult: Secondary | ICD-10-CM | POA: Diagnosis not present

## 2021-11-10 DIAGNOSIS — F39 Unspecified mood [affective] disorder: Secondary | ICD-10-CM | POA: Diagnosis not present

## 2021-12-11 DIAGNOSIS — Z1231 Encounter for screening mammogram for malignant neoplasm of breast: Secondary | ICD-10-CM | POA: Diagnosis not present

## 2021-12-14 DIAGNOSIS — F39 Unspecified mood [affective] disorder: Secondary | ICD-10-CM | POA: Diagnosis not present

## 2021-12-28 DIAGNOSIS — F39 Unspecified mood [affective] disorder: Secondary | ICD-10-CM | POA: Diagnosis not present

## 2022-01-05 DIAGNOSIS — F39 Unspecified mood [affective] disorder: Secondary | ICD-10-CM | POA: Diagnosis not present

## 2022-01-27 DIAGNOSIS — F39 Unspecified mood [affective] disorder: Secondary | ICD-10-CM | POA: Diagnosis not present

## 2022-02-03 DIAGNOSIS — F39 Unspecified mood [affective] disorder: Secondary | ICD-10-CM | POA: Diagnosis not present

## 2022-02-21 DIAGNOSIS — F39 Unspecified mood [affective] disorder: Secondary | ICD-10-CM | POA: Diagnosis not present

## 2022-02-28 DIAGNOSIS — F39 Unspecified mood [affective] disorder: Secondary | ICD-10-CM | POA: Diagnosis not present

## 2022-03-16 ENCOUNTER — Ambulatory Visit: Payer: BC Managed Care – PPO | Admitting: Family Medicine

## 2022-03-21 DIAGNOSIS — F39 Unspecified mood [affective] disorder: Secondary | ICD-10-CM | POA: Diagnosis not present

## 2022-03-29 DIAGNOSIS — F39 Unspecified mood [affective] disorder: Secondary | ICD-10-CM | POA: Diagnosis not present

## 2022-04-07 DIAGNOSIS — F39 Unspecified mood [affective] disorder: Secondary | ICD-10-CM | POA: Diagnosis not present

## 2022-04-13 DIAGNOSIS — F39 Unspecified mood [affective] disorder: Secondary | ICD-10-CM | POA: Diagnosis not present

## 2022-04-19 DIAGNOSIS — E039 Hypothyroidism, unspecified: Secondary | ICD-10-CM | POA: Diagnosis not present

## 2022-04-19 DIAGNOSIS — N952 Postmenopausal atrophic vaginitis: Secondary | ICD-10-CM | POA: Diagnosis not present

## 2022-04-19 DIAGNOSIS — Z6830 Body mass index (BMI) 30.0-30.9, adult: Secondary | ICD-10-CM | POA: Diagnosis not present

## 2022-04-21 DIAGNOSIS — F39 Unspecified mood [affective] disorder: Secondary | ICD-10-CM | POA: Diagnosis not present

## 2022-04-25 DIAGNOSIS — F39 Unspecified mood [affective] disorder: Secondary | ICD-10-CM | POA: Diagnosis not present

## 2022-05-05 DIAGNOSIS — F39 Unspecified mood [affective] disorder: Secondary | ICD-10-CM | POA: Diagnosis not present

## 2022-05-09 DIAGNOSIS — F39 Unspecified mood [affective] disorder: Secondary | ICD-10-CM | POA: Diagnosis not present

## 2022-05-16 DIAGNOSIS — F39 Unspecified mood [affective] disorder: Secondary | ICD-10-CM | POA: Diagnosis not present

## 2022-05-18 NOTE — Patient Instructions (Signed)
Below is our plan:  We will continue propranolol '40mg'$  twice daily and nortriptyline '30mg'$  at bedtime. Continue counseling. Consider discussion with PCP regarding mood management medicaitons. We can consider weaning meds or adding meds in the future.  Please make sure you are staying well hydrated. I recommend 50-60 ounces daily. Well balanced diet and regular exercise encouraged. Consistent sleep schedule with 6-8 hours recommended.   Please continue follow up with care team as directed.   Follow up with me in 1 year, sooner if needed  You may receive a survey regarding today's visit. I encourage you to leave honest feed back as I do use this information to improve patient care. Thank you for seeing me today!

## 2022-05-18 NOTE — Progress Notes (Signed)
PATIENT: Anna Orr DOB: [birth year]  REASON FOR VISIT: follow up HISTORY FROM: patient  Chief Complaint  Patient presents with   Follow-up    Pt in room #1 with her husband. Pt here today for f/u headaches.     HISTORY OF PRESENT ILLNESS:  05/23/22 ALL:  Anna Orr returns for follow up for headaches. She continues nortriptyline '30mg'$  QHS and propranolol '40mg'$  BID. She reports headaches are fairly stable. She feels they have worsened over the past week but she feels this is directly related to increased stress. She is working with a Social worker. She reports headaches occur 4-15 times a month. Usually easily aborted with ibuprofen and caffeine. She is hesitant to add or change medications at this time. She has been working on a healthy diet and regular exercise.   09/13/2021 ALL: Anna Orr returns for follow up for headaches. She continues nortriptyline '30mg'$  QHS and propranolol '40mg'$  BID. She reports that headaches have been fairly well managed until recently. She is going through a stressful time and reports more grieving. He husband is with her today and states they experienced an event years ago that has resulted in more stress in the home. This is when headaches first presented. She was doing better for a period of time while going to church. Recently, this event is causing more stress for her and correlates with worsening headaches. She was seeing a counselor but did not feel she meshed well with them. She has called a new counselor and waiting to get an appt. She describes tension style headaches. No migrainous features. She admits that she does not drink much water. She continues to follow up with PCP regularly. She would like to stop nortriptyline and propranolol in the future if headaches improve.   09/10/2020 ALL: She continues to do well. She continues nortriptyline '30mg'$  at bedtime and propranolol '40mg'$  BID. Tension style pressure all over her head. She feels that she can't hear as well but  no photo or phonophobia. No nausea or vomiting. She usually takes ibuprofen which helps. She knows that she doesn't drink as much water as she should. She continues to follow up closely with PCP. Now on Synthroid 87mg daily.   04/03/2019 ALL: Anna Orr a 56y.o. female here today for follow up for headaches. She continues nortriptyline '30mg'$  at bedtime. She is also taking propranolol '40mg'$  twice daily. She reports doing pretty good. She feels that she may have 2-3 headaches a month. Weather and stress are known triggers. They are not as severe as they used to be. She is able to work and do all ADL's. She tries to exercise 3-4 times a week.   HISTORY: (copied from Anna WJannifer Orr note on 04/12/2019)  [name] a 56year old right-handed white female with a history of an unusual headache syndrome.  The patient has headaches that may occur with exercise, she is able to do some forms of exercise such as walking on the treadmill, doing hand weights, but if she does the elliptical or does more strenuous weights then she will get a headache.  Heat exposure, and weather changes are also activators.  Occasionally, stress will bring on headache.  The headaches may be associated with neck and shoulder pressure going into the head, and she has pressure into the buttocks area.  The patient has some pulsation type sensations with the hearing, but light does not bother her.  She drinks 2 to 3 cups of coffee daily, she does not drink  a lot of tea or other caffeinated products.  The patient may go several weeks without a headache if she avoids the activators.  The patient has gained benefit with the nortriptyline, she no longer is missing work due to headache.  She has had fewer headaches and she has been able to get back to some forms of exercise while using the medication.  She is on a 30 mg dose at night.  She comes to this office for an evaluation   REVIEW OF SYSTEMS: Out of a complete 14 system review of symptoms,  the patient complains only of the following symptoms, stress, grief, and all other reviewed systems are negative.   ALLERGIES: Allergies  Allergen Reactions   Gabapentin     Had SE, unable to tolerate    HOME MEDICATIONS: Outpatient Medications Prior to Visit  Medication Sig Dispense Refill   OMEPRAZOLE PO Take 3 tablets by mouth at bedtime.      SYNTHROID 75 MCG tablet Take 75 mcg by mouth daily before breakfast.     nortriptyline (PAMELOR) 10 MG capsule Take 3 capsules (30 mg total) by mouth at bedtime. 270 capsule 3   propranolol (INDERAL) 20 MG tablet TAKE 2 TABLETS BY MOUTH TWICE A DAY 360 tablet 3   No facility-administered medications prior to visit.    PAST MEDICAL HISTORY: Past Medical History:  Diagnosis Date   DDD (degenerative disc disease), cervical    DDD (degenerative disc disease), lumbar    Headache syndrome 04/11/2018   Hypothyroidism    Kidney stone     PAST SURGICAL HISTORY: Past Surgical History:  Procedure Laterality Date   BLOOD PATCH     states cysts on spine were leaking and treated with blood patch   BREAST ENHANCEMENT SURGERY     COLONOSCOPY N/A 11/07/2014   Procedure: COLONOSCOPY;  Surgeon: Danie Binder, MD;  Location: [facility];  Service: Endoscopy;  Laterality: N/A;  0830    NECK SURGERY     2017   TUBAL LIGATION      FAMILY HISTORY: Family History  Problem Relation Age of Onset   Eczema Sister    Colon cancer Neg Hx    Allergic rhinitis Neg Hx    Asthma Neg Hx    Urticaria Neg Hx     SOCIAL HISTORY: Social History   Socioeconomic History   Marital status: Married    Spouse name: Not on file   Number of children: Not on file   Years of education: Not on file   Highest education level: Not on file  Occupational History   Occupation: printing    Comment: lifting, pushing  Tobacco Use   Smoking status: Never   Smokeless tobacco: Never  Substance and Sexual Activity   Alcohol use: No    Alcohol/week: 0.0 standard  drinks of alcohol    Comment: 1 mixed drink a day    Drug use: No   Sexual activity: Not on file  Other Topics Concern   Not on file  Social History Narrative   ** Merged History Encounter **       Social Determinants of Health   Financial Resource Strain: Not on file  Food Insecurity: Not on file  Transportation Needs: Not on file  Physical Activity: Not on file  Stress: Not on file  Social Connections: Not on file  Intimate Partner Violence: Not on file     PHYSICAL EXAM  Vitals:   05/23/22 1249  BP: 114/65  Pulse: 63  Weight: 197 lb (89.4 kg)  Height: '5\' 7"'$  (1.702 m)     Body mass index is 30.85 kg/m.  Generalized: Well developed, in no acute distress  Cardiology: normal rate and rhythm, no murmur noted Neurological examination  Mentation: Alert oriented to time, place, history taking. Follows all commands speech and language fluent Cranial nerve II-XII: Pupils were equal round reactive to light. Extraocular movements were full, visual field were full on confrontational test. Facial sensation and strength were normal. Head turning and shoulder shrug  were normal and symmetric. Motor: The motor testing reveals 5 over 5 strength of all 4 extremities. Good symmetric motor tone is noted throughout.  Gait and station: Gait is normal.   DIAGNOSTIC DATA (LABS, IMAGING, TESTING) - I reviewed patient records, labs, notes, testing and imaging myself where available.      No data to display           Lab Results  Component Value Date   WBC 7.6 03/14/2016   HGB 14.5 03/14/2016   HCT 43.8 03/14/2016   MCV 92.8 03/14/2016   PLT 234 03/14/2016      Component Value Date/Time   NA 139 03/14/2016 1420   K 3.9 03/14/2016 1420   CL 107 03/14/2016 1420   CO2 25 03/14/2016 1420   GLUCOSE 87 03/14/2016 1420   BUN 16 03/14/2016 1420   CREATININE 0.78 03/14/2016 1420   CALCIUM 8.9 03/14/2016 1420   PROT 6.8 05/24/2015 1905   ALBUMIN 3.9 05/24/2015 1905   AST 13 (L)  05/24/2015 1905   ALT 12 (L) 05/24/2015 1905   ALKPHOS 66 05/24/2015 1905   BILITOT 0.6 05/24/2015 1905   GFRNONAA >60 03/14/2016 1420   GFRAA >60 03/14/2016 1420   No results found for: "CHOL", "HDL", "LDLCALC", "LDLDIRECT", "TRIG", "CHOLHDL" No results found for: "HGBA1C" No results found for: "VITAMINB12" Lab Results  Component Value Date   TSH 4.460 04/11/2018       ASSESSMENT AND PLAN 56 y.o. year old female  has a past medical history of DDD (degenerative disc disease), cervical, DDD (degenerative disc disease), lumbar, Headache syndrome (04/11/2018), Hypothyroidism, and Kidney stone. here with     ICD-10-CM   1. Headache syndrome  G44.89 propranolol (INDERAL) 20 MG tablet    nortriptyline (PAMELOR) 10 MG capsule       She reports that headaches were previously well managed but seem to be worse with increased stressors. She is tolerating propranolol and nortriptyline without obvious adverse effects.  We will continue propranolol 40 mg twice daily as well as nortriptyline 30 mg at night. I have encouraged her to continuing reaching out to her counselor. She was encouraged to call me if headaches worsen. I have encouraged her to increase water intake and avoid known triggers. She will continue OTC analgesics for abortive therapy but advised against regular use. I will plan to have her followed by Anna Billey Gosling in [place] retirement. She will follow-up with me in 1 year.  She verbalizes an agreement with this plan.   No orders of the defined types were placed in this encounter.    Meds ordered this encounter  Medications   propranolol (INDERAL) 20 MG tablet    Sig: TAKE 2 TABLETS BY MOUTH TWICE A DAY    Dispense:  360 tablet    Refill:  3    Order Specific Question:   Supervising Provider    Answer:   Melvenia Beam [[id number]]   nortriptyline (PAMELOR) 10  MG capsule    Sig: Take 3 capsules (30 mg total) by mouth at bedtime.    Dispense:  270 capsule    Refill:  3    Order  Specific Question:   Supervising Provider    Answer:   Melvenia Beam [[id number]]      MWU XLKGM, FNP-C 05/23/2022, 1:43 PM [facility] [address], [place], [place] [address] [phone]

## 2022-05-23 ENCOUNTER — Ambulatory Visit: Payer: BC Managed Care – PPO | Admitting: Family Medicine

## 2022-05-23 ENCOUNTER — Encounter: Payer: Self-pay | Admitting: Family Medicine

## 2022-05-23 VITALS — BP 114/65 | HR 63 | Ht 67.0 in | Wt 197.0 lb

## 2022-05-23 DIAGNOSIS — G4489 Other headache syndrome: Secondary | ICD-10-CM | POA: Diagnosis not present

## 2022-05-23 MED ORDER — PROPRANOLOL HCL 20 MG PO TABS: ORAL_TABLET | ORAL | 3 refills | Status: DC

## 2022-05-23 MED ORDER — NORTRIPTYLINE HCL 10 MG PO CAPS: 30.0000 mg | ORAL_CAPSULE | Freq: Every day | ORAL | 3 refills | Status: DC

## 2022-05-25 DIAGNOSIS — F39 Unspecified mood [affective] disorder: Secondary | ICD-10-CM | POA: Diagnosis not present

## 2022-06-02 DIAGNOSIS — F39 Unspecified mood [affective] disorder: Secondary | ICD-10-CM | POA: Diagnosis not present

## 2022-06-14 DIAGNOSIS — F39 Unspecified mood [affective] disorder: Secondary | ICD-10-CM | POA: Diagnosis not present

## 2022-06-28 DIAGNOSIS — F39 Unspecified mood [affective] disorder: Secondary | ICD-10-CM | POA: Diagnosis not present

## 2022-07-06 DIAGNOSIS — F39 Unspecified mood [affective] disorder: Secondary | ICD-10-CM | POA: Diagnosis not present

## 2022-07-12 DIAGNOSIS — F39 Unspecified mood [affective] disorder: Secondary | ICD-10-CM | POA: Diagnosis not present

## 2022-07-19 DIAGNOSIS — F39 Unspecified mood [affective] disorder: Secondary | ICD-10-CM | POA: Diagnosis not present

## 2022-08-11 DIAGNOSIS — J069 Acute upper respiratory infection, unspecified: Secondary | ICD-10-CM | POA: Diagnosis not present

## 2022-08-11 DIAGNOSIS — Z6831 Body mass index (BMI) 31.0-31.9, adult: Secondary | ICD-10-CM | POA: Diagnosis not present

## 2022-08-11 DIAGNOSIS — E669 Obesity, unspecified: Secondary | ICD-10-CM | POA: Diagnosis not present

## 2022-08-16 ENCOUNTER — Other Ambulatory Visit: Payer: Self-pay

## 2022-08-16 ENCOUNTER — Emergency Department (HOSPITAL_COMMUNITY)
Admission: EM | Admit: 2022-08-16 | Discharge: 2022-08-16 | Disposition: A | Discharge status: Home / Self Care | Payer: BC Managed Care – PPO | Attending: Student | Admitting: Student

## 2022-08-16 ENCOUNTER — Emergency Department (HOSPITAL_COMMUNITY): Payer: BC Managed Care – PPO

## 2022-08-16 ENCOUNTER — Encounter (HOSPITAL_COMMUNITY): Payer: Self-pay | Admitting: Emergency Medicine

## 2022-08-16 DIAGNOSIS — J101 Influenza due to other identified influenza virus with other respiratory manifestations: Secondary | ICD-10-CM

## 2022-08-16 DIAGNOSIS — R059 Cough, unspecified: Secondary | ICD-10-CM | POA: Diagnosis not present

## 2022-08-16 DIAGNOSIS — Z79890 Hormone replacement therapy (postmenopausal): Secondary | ICD-10-CM | POA: Insufficient documentation

## 2022-08-16 DIAGNOSIS — J1089 Influenza due to other identified influenza virus with other manifestations: Secondary | ICD-10-CM | POA: Diagnosis not present

## 2022-08-16 DIAGNOSIS — R030 Elevated blood-pressure reading, without diagnosis of hypertension: Secondary | ICD-10-CM | POA: Diagnosis not present

## 2022-08-16 DIAGNOSIS — J189 Pneumonia, unspecified organism: Secondary | ICD-10-CM | POA: Diagnosis not present

## 2022-08-16 DIAGNOSIS — Z6831 Body mass index (BMI) 31.0-31.9, adult: Secondary | ICD-10-CM | POA: Diagnosis not present

## 2022-08-16 DIAGNOSIS — E669 Obesity, unspecified: Secondary | ICD-10-CM | POA: Diagnosis not present

## 2022-08-16 DIAGNOSIS — E039 Hypothyroidism, unspecified: Secondary | ICD-10-CM | POA: Insufficient documentation

## 2022-08-16 MED ORDER — AZITHROMYCIN 250 MG PO TABS: 250.0000 mg | ORAL_TABLET | Freq: Every day | ORAL | 0 refills | Status: DC

## 2022-08-16 MED ORDER — AMOXICILLIN-POT CLAVULANATE 875-125 MG PO TABS: 1.0000 | ORAL_TABLET | Freq: Two times a day (BID) | ORAL | 0 refills | Status: DC

## 2022-08-16 MED ORDER — PREDNISONE 10 MG PO TABS: 40.0000 mg | ORAL_TABLET | Freq: Every day | ORAL | 0 refills | Status: AC

## 2022-08-16 NOTE — Discharge Instructions (Addendum)
Revisited with several consistent with community-acquired pneumonia on top with influenza A infection.  As discussed, we will send in antibiotics to take for the next 5 days.  Also recommend taking oral steroids given evidence of diffuse wheeze noted on exam.  Continue to take at home albuterol inhaler as well as Mucinex for her symptoms.  Take Tylenol/Motrin as needed for pain/fever.  Recommend follow-up with primary care for reassessment.  Please do not hesitate to return to emergency department for worrisome signs and symptoms we discussed become apparent.

## 2022-08-16 NOTE — ED Triage Notes (Signed)
Pt c/o chest congestion and np cough. Pt seen at urgent care and sent to ED. FLu A +.

## 2022-08-16 NOTE — ED Provider Notes (Signed)
[facility] EMERGENCY DEPARTMENT Provider Note   CSN: [id number] Arrival date & time: 08/16/22  1422     History  Chief Complaint  Patient presents with   Cough    Anna Orr is a 57 y.o. female.   Cough   57 year old female presents emergency department with complaints of cough and perceived chest congestion for the past 3 days.  Was seen in urgent care outpatient earlier today and diagnosed with influenza A and was sent to the emergency department for further evaluation.  Patient reports subjective fever at home but is currently afebrile.  Has been taking at home Mucinex which has helped some.  Denies nasal congestion, sore throat, chest pain, shortness of breath, abdominal pain, nausea, vomiting, urinary symptoms, change in bowel habits.  Past medical history significant for nephrolithiasis, headache, degenerative disc disease, hypothyroidism  Home Medications Prior to Admission medications   Medication Sig Start Date End Date Taking? Authorizing Provider  amoxicillin-clavulanate (AUGMENTIN) 875-125 MG tablet Take 1 tablet by mouth every 12 (twelve) hours. 08/16/22  Yes Dion Saucier A, PA  azithromycin (ZITHROMAX) 250 MG tablet Take 1 tablet (250 mg total) by mouth daily. Take first 2 tablets together, then 1 every day until finished. 08/16/22  Yes Dion Saucier A, PA  predniSONE (DELTASONE) 10 MG tablet Take 4 tablets (40 mg total) by mouth daily. 08/16/22  Yes Dion Saucier A, PA  nortriptyline (PAMELOR) 10 MG capsule Take 3 capsules (30 mg total) by mouth at bedtime. 05/23/22   Lomax, Amy, NP  OMEPRAZOLE PO Take 3 tablets by mouth at bedtime.     [provider]  propranolol (INDERAL) 20 MG tablet TAKE 2 TABLETS BY MOUTH TWICE A DAY 05/23/22   Lomax, Amy, NP  SYNTHROID 75 MCG tablet Take 75 mcg by mouth daily before breakfast. 01/25/19   [provider]      Allergies    Gabapentin    Review of Systems   Review of Systems  Respiratory:  Positive  for cough.   All other systems reviewed and are negative.   Physical Exam Updated Vital Signs BP 127/75   Pulse 71   Temp 98.4 F (36.9 C)   Resp 20   Ht '5\' 7"'$  (1.702 m)   Wt 89.4 kg   SpO2 98%   BMI 30.85 kg/m  Physical Exam Vitals and nursing note reviewed.  Constitutional:      General: She is not in acute distress.    Appearance: She is well-developed.  HENT:     Head: Normocephalic and atraumatic.  Eyes:     Conjunctiva/sclera: Conjunctivae normal.  Cardiovascular:     Rate and Rhythm: Normal rate and regular rhythm.     Heart sounds: No murmur heard. Pulmonary:     Effort: Pulmonary effort is normal. No respiratory distress.     Breath sounds: Wheezing and rhonchi present.     Comments: Diffuse wheeze/rhonchi auscultated throughout lung fields, mild in nature. Abdominal:     Palpations: Abdomen is soft.     Tenderness: There is no abdominal tenderness. There is no right CVA tenderness or left CVA tenderness.  Musculoskeletal:        General: No swelling.     Cervical back: Neck supple.  Skin:    General: Skin is warm and dry.     Capillary Refill: Capillary refill takes less than 2 seconds.  Neurological:     Mental Status: She is alert.  Psychiatric:  Mood and Affect: Mood normal.     ED Results / Procedures / Treatments   Labs (all labs ordered are listed, but only abnormal results are displayed) Labs Reviewed - No data to display  EKG None  Radiology DG Chest 2 View  Result Date: 08/16/2022 CLINICAL DATA:  Cough. EXAM: CHEST - 2 VIEW COMPARISON:  None Available. FINDINGS: New bibasilar opacities are identified with somewhat platelike components. The heart hila, and mediastinum are normal. No pulmonary nodules or masses. No focal infiltrates. No pneumothorax IMPRESSION: Bibasilar infiltrates have platelike components suggesting the possibility of atelectasis. Developing pneumonia not excluded. Recommend follow-up to resolution Electronically  Signed   By: Dorise Bullion III M.D.   On: 08/16/2022 15:04    Procedures Procedures    Medications Ordered in ED Medications - No data to display  ED Course/ Medical Decision Making/ A&P Clinical Course as of 08/16/22 [place] Aug 16, 2022  1506 X 3 days coughc Mucinex Urgent care [CR]    Clinical Course User Index [CR] Wilnette Kales, PA                           Medical Decision Making Amount and/or Complexity of Data Reviewed Radiology: ordered.  Risk Prescription drug management.   This patient presents to the ED for concern of cough/chest congestion, this involves an extensive number of treatment options, and is a complaint that carries with it a high risk of complications and morbidity.  The differential diagnosis includes influenza, COVID, RSV, pneumonia, meningitis, sepsis, COPD/asthma exacerbation   Co morbidities that complicate the patient evaluation  See HPI   Additional history obtained:  Additional history obtained from EMR External records from outside source obtained and reviewed including hospital records   Lab Tests:  Respiratory viral panel done outpatient urgent care with positive influenza A  Imaging Studies ordered:  I ordered imaging studies including chest x-ray I independently visualized and interpreted imaging which showed bibasilar infiltrates have platelike component suggesting possible atelectasis.  Developing pneumonia cannot be excluded. I agree with the radiologist interpretation   Cardiac Monitoring: / EKG:  The patient was maintained on a cardiac monitor.  I personally viewed and interpreted the cardiac monitored which showed an underlying rhythm of: Sinus rhythm   Consultations Obtained:  N/a   Problem List / ED Course / Critical interventions / Medication management  Community-acquired pneumonia Reevaluation of the patient  showed that the patient stayed the same I have reviewed the patients home medicines  and have made adjustments as needed   Social Determinants of Health:  Denies tobacco/illicit drug use   Test / Admission - Considered:  Commune acquired pneumonia Vitals signs  within normal range and stable throughout visit. Laboratory/imaging studies significant for: See above Patient with evidence of community-acquired pneumonia with concurrent influenza A infection.  Patient afebrile, nonhypoxic on room air with no concerning past medical history.  Deemed reasonable for outpatient oral antibiotic trial.  Recommend continued Mucinex outpatient with cessation of cough suppressant prescribed by urgent care.  Patient prescribed oral prednisone by urgent care as well which was advised to be continued secondary to diffuse wheeze auscultated on respiratory exam as well as albuterol inhaler.  Recommend reevaluation by primary care in 2 to 3 days for reassessment.  Treatment plan discussed at length with the patient and she acknowledged understanding was agreeable to said plan. Worrisome signs and symptoms were discussed with the patient, and the patient acknowledged  understanding to return to the ED if noticed. Patient was stable upon discharge.          Final Clinical Impression(s) / ED Diagnoses Final diagnoses:  Community acquired pneumonia, unspecified laterality  Influenza A    Rx / DC Orders ED Discharge Orders          Ordered    amoxicillin-clavulanate (AUGMENTIN) 875-125 MG tablet  Every 12 hours        08/16/22 1525    azithromycin (ZITHROMAX) 250 MG tablet  Daily        08/16/22 1525    predniSONE (DELTASONE) 10 MG tablet  Daily        08/16/22 1526              Wilnette Kales, [place] 08/16/22 1550    Teressa Lower, MD 08/16/22 2014

## 2022-08-23 DIAGNOSIS — E063 Autoimmune thyroiditis: Secondary | ICD-10-CM | POA: Diagnosis not present

## 2022-08-23 DIAGNOSIS — Z6830 Body mass index (BMI) 30.0-30.9, adult: Secondary | ICD-10-CM | POA: Diagnosis not present

## 2022-08-23 DIAGNOSIS — E6609 Other obesity due to excess calories: Secondary | ICD-10-CM | POA: Diagnosis not present

## 2022-08-23 DIAGNOSIS — J189 Pneumonia, unspecified organism: Secondary | ICD-10-CM | POA: Diagnosis not present

## 2022-08-23 DIAGNOSIS — J9801 Acute bronchospasm: Secondary | ICD-10-CM | POA: Diagnosis not present

## 2022-09-02 DIAGNOSIS — E063 Autoimmune thyroiditis: Secondary | ICD-10-CM | POA: Diagnosis not present

## 2022-09-02 DIAGNOSIS — J9801 Acute bronchospasm: Secondary | ICD-10-CM | POA: Diagnosis not present

## 2022-09-02 DIAGNOSIS — J189 Pneumonia, unspecified organism: Secondary | ICD-10-CM | POA: Diagnosis not present

## 2022-09-02 DIAGNOSIS — E6609 Other obesity due to excess calories: Secondary | ICD-10-CM | POA: Diagnosis not present

## 2022-09-02 DIAGNOSIS — Z6830 Body mass index (BMI) 30.0-30.9, adult: Secondary | ICD-10-CM | POA: Diagnosis not present

## 2023-05-16 DIAGNOSIS — Z6831 Body mass index (BMI) 31.0-31.9, adult: Secondary | ICD-10-CM | POA: Diagnosis not present

## 2023-05-16 DIAGNOSIS — E6609 Other obesity due to excess calories: Secondary | ICD-10-CM | POA: Diagnosis not present

## 2023-05-16 DIAGNOSIS — E063 Autoimmune thyroiditis: Secondary | ICD-10-CM | POA: Diagnosis not present

## 2023-05-16 DIAGNOSIS — Z1331 Encounter for screening for depression: Secondary | ICD-10-CM | POA: Diagnosis not present

## 2023-05-16 DIAGNOSIS — Z0000 Encounter for general adult medical examination without abnormal findings: Secondary | ICD-10-CM | POA: Diagnosis not present

## 2023-05-17 DIAGNOSIS — E063 Autoimmune thyroiditis: Secondary | ICD-10-CM | POA: Diagnosis not present

## 2023-05-17 DIAGNOSIS — Z0000 Encounter for general adult medical examination without abnormal findings: Secondary | ICD-10-CM | POA: Diagnosis not present

## 2023-05-24 ENCOUNTER — Ambulatory Visit: Payer: BC Managed Care – PPO | Admitting: Family Medicine

## 2023-07-18 ENCOUNTER — Other Ambulatory Visit: Payer: Self-pay

## 2023-07-18 DIAGNOSIS — G4489 Other headache syndrome: Secondary | ICD-10-CM

## 2023-07-20 ENCOUNTER — Telehealth: Payer: Self-pay | Admitting: Family Medicine

## 2023-07-20 DIAGNOSIS — G4489 Other headache syndrome: Secondary | ICD-10-CM

## 2023-07-20 MED ORDER — PROPRANOLOL HCL 20 MG PO TABS: ORAL_TABLET | ORAL | 0 refills | Status: DC

## 2023-07-20 NOTE — Telephone Encounter (Signed)
Last seen on 05/23/22 Follow up scheduled on 08/31/23 Rx sent

## 2023-07-20 NOTE — Telephone Encounter (Signed)
Pt called and scheduled a year f/u. She also is needing a refill on her propranolol (INDERAL) 20 MG tablet and is needing it sent to the [facility] in [place]. Pt is completely out. Please advise.

## 2023-08-25 DIAGNOSIS — J019 Acute sinusitis, unspecified: Secondary | ICD-10-CM | POA: Diagnosis not present

## 2023-08-25 DIAGNOSIS — R509 Fever, unspecified: Secondary | ICD-10-CM | POA: Diagnosis not present

## 2023-08-26 ENCOUNTER — Other Ambulatory Visit: Payer: Self-pay

## 2023-08-26 ENCOUNTER — Emergency Department (HOSPITAL_COMMUNITY)
Admission: EM | Admit: 2023-08-26 | Discharge: 2023-08-27 | Disposition: A | Discharge status: Home / Self Care | Payer: BC Managed Care – PPO | Attending: Emergency Medicine | Admitting: Emergency Medicine

## 2023-08-26 ENCOUNTER — Encounter (HOSPITAL_COMMUNITY): Payer: Self-pay | Admitting: Emergency Medicine

## 2023-08-26 ENCOUNTER — Emergency Department (HOSPITAL_COMMUNITY): Payer: BC Managed Care – PPO

## 2023-08-26 DIAGNOSIS — R519 Headache, unspecified: Secondary | ICD-10-CM | POA: Diagnosis not present

## 2023-08-26 DIAGNOSIS — E039 Hypothyroidism, unspecified: Secondary | ICD-10-CM | POA: Diagnosis not present

## 2023-08-26 DIAGNOSIS — R509 Fever, unspecified: Secondary | ICD-10-CM | POA: Diagnosis not present

## 2023-08-26 DIAGNOSIS — B974 Respiratory syncytial virus as the cause of diseases classified elsewhere: Secondary | ICD-10-CM | POA: Insufficient documentation

## 2023-08-26 DIAGNOSIS — B338 Other specified viral diseases: Secondary | ICD-10-CM

## 2023-08-26 DIAGNOSIS — R059 Cough, unspecified: Secondary | ICD-10-CM | POA: Diagnosis not present

## 2023-08-26 DIAGNOSIS — Z20822 Contact with and (suspected) exposure to covid-19: Secondary | ICD-10-CM | POA: Insufficient documentation

## 2023-08-26 DIAGNOSIS — Z79890 Hormone replacement therapy (postmenopausal): Secondary | ICD-10-CM | POA: Insufficient documentation

## 2023-08-26 DIAGNOSIS — H9203 Otalgia, bilateral: Secondary | ICD-10-CM | POA: Insufficient documentation

## 2023-08-26 LAB — URINALYSIS, ROUTINE W REFLEX MICROSCOPIC
Bacteria, UA: NONE SEEN
Bilirubin Urine: NEGATIVE
Glucose, UA: NEGATIVE mg/dL
Hgb urine dipstick: NEGATIVE
Ketones, ur: NEGATIVE mg/dL
Nitrite: NEGATIVE
Protein, ur: NEGATIVE mg/dL
Specific Gravity, Urine: 1.016 (ref 1.005–1.030)
pH: 5 (ref 5.0–8.0)

## 2023-08-26 LAB — BASIC METABOLIC PANEL
Anion gap: 6 (ref 5–15)
BUN: 19 mg/dL (ref 6–20)
CO2: 24 mmol/L (ref 22–32)
Calcium: 9 mg/dL (ref 8.9–10.3)
Chloride: 109 mmol/L (ref 98–111)
Creatinine, Ser: 0.99 mg/dL (ref 0.44–1.00)
GFR, Estimated: >60 mL/min (ref >60)
Glucose, Bld: 131 mg/dL — ABNORMAL HIGH (ref 70–99)
Potassium: 3.2 mmol/L — ABNORMAL LOW (ref 3.5–5.1)
Sodium: 139 mmol/L (ref 135–145)

## 2023-08-26 LAB — CBC
HCT: 44.8 % (ref 36.0–46.0)
Hemoglobin: 14.6 g/dL (ref 12.0–15.0)
MCH: 29.2 pg (ref 26.0–34.0)
MCHC: 32.6 g/dL (ref 30.0–36.0)
MCV: 89.6 fL (ref 80.0–100.0)
Platelets: 361 10*3/uL (ref 150–400)
RBC: 5 MIL/uL (ref 3.87–5.11)
RDW: 13.7 % (ref 11.5–15.5)
WBC: 10.7 10*3/uL — ABNORMAL HIGH (ref 4.0–10.5)
nRBC: 0 % (ref 0.0–0.2)

## 2023-08-26 LAB — RESP PANEL BY RT-PCR (RSV, FLU A&B, COVID)  RVPGX2
Influenza A by PCR: NEGATIVE
Influenza B by PCR: NEGATIVE
Resp Syncytial Virus by PCR: POSITIVE — AB
SARS Coronavirus 2 by RT PCR: NEGATIVE

## 2023-08-26 NOTE — ED Triage Notes (Signed)
Pt in with persistent fever, cough and HA since 1/10. Seen at [facility] and placed on amoxicillin which will end tomorrow. Pt states she tested negative for flu and covid at UC last week. Last took Ibuprofen at 8pm, 98.8 in triage.

## 2023-08-27 NOTE — ED Notes (Signed)
Patient verbalizes understanding of discharge instructions. Opportunity for questioning and answers were provided. Armband removed by staff, pt discharged from ED. Ambulated out to lobby  

## 2023-08-27 NOTE — ED Provider Notes (Signed)
[place] EMERGENCY DEPARTMENT AT [facility] Provider Note   CSN: [id number] Arrival date & time: 08/26/23  2110     History  Chief Complaint  Patient presents with   Fever    Anna Orr is a 58 y.o. female.  HPI     This is a 58 year old female who presents with concerns for ongoing fever, cough, headache, ear pain.  She was seen and evaluated at urgent care and placed on amoxicillin.  She believes it may have been for an ear infection.  She states she has continued to have some fevers up to 102 at home.  She tested negative for COVID and flu last week.  She is around her grandchildren including a 35-day-old infant.  She is most concerned that she is continue to have some fevers.  Home Medications Prior to Admission medications   Medication Sig Start Date End Date Taking? Authorizing Provider  amoxicillin-clavulanate (AUGMENTIN) 875-125 MG tablet Take 1 tablet by mouth every 12 (twelve) hours. 08/16/22   Peter Garter, PA  azithromycin (ZITHROMAX) 250 MG tablet Take 1 tablet (250 mg total) by mouth daily. Take first 2 tablets together, then 1 every day until finished. 08/16/22   Peter Garter, PA  nortriptyline (PAMELOR) 10 MG capsule Take 3 capsules (30 mg total) by mouth at bedtime. 05/23/22   Lomax, Amy, NP  OMEPRAZOLE PO Take 3 tablets by mouth at bedtime.     [provider]  predniSONE (DELTASONE) 10 MG tablet Take 4 tablets (40 mg total) by mouth daily. 08/16/22   Peter Garter, PA  propranolol (INDERAL) 20 MG tablet TAKE 2 TABLETS BY MOUTH TWICE A DAY 07/20/23   Lomax, Amy, NP  SYNTHROID 75 MCG tablet Take 75 mcg by mouth daily before breakfast. 01/25/19   [provider]      Allergies    Gabapentin    Review of Systems   Review of Systems  Constitutional:  Positive for fever.  HENT:  Positive for congestion and ear pain.   Respiratory:  Positive for cough.   Cardiovascular:  Negative for chest pain.  Gastrointestinal:   Negative for abdominal pain, nausea and vomiting.  Neurological:  Positive for headaches.  All other systems reviewed and are negative.   Physical Exam Updated Vital Signs BP (!) 110/58 (BP Location: Right Arm)   Pulse 83   Temp 98.3 F (36.8 C) (Oral)   Resp 17   Wt 89.4 kg   SpO2 99%   BMI 30.87 kg/m  Physical Exam Vitals and nursing note reviewed.  Constitutional:      Appearance: She is well-developed. She is obese. She is not ill-appearing.  HENT:     Head: Normocephalic and atraumatic.     Ears:     Comments: Bilateral TMs clear, slight effusion bilaterally without bulging or erythema    Nose: Congestion present.     Mouth/Throat:     Mouth: Mucous membranes are moist.     Comments: No erythema Eyes:     Pupils: Pupils are equal, round, and reactive to light.  Neck:     Comments: No meningismus Cardiovascular:     Rate and Rhythm: Normal rate and regular rhythm.     Heart sounds: Normal heart sounds.  Pulmonary:     Effort: Pulmonary effort is normal. No respiratory distress.     Breath sounds: No wheezing.  Abdominal:     General: Bowel sounds are normal.     Palpations:  Abdomen is soft.  Musculoskeletal:     Cervical back: Normal range of motion and neck supple.  Skin:    General: Skin is warm and dry.  Neurological:     Mental Status: She is alert and oriented to person, place, and time.  Psychiatric:        Mood and Affect: Mood normal.     ED Results / Procedures / Treatments   Labs (all labs ordered are listed, but only abnormal results are displayed) Labs Reviewed  RESP PANEL BY RT-PCR (RSV, FLU A&B, COVID)  RVPGX2 - Abnormal; Notable for the following components:      Result Value   Resp Syncytial Virus by PCR POSITIVE (*)    All other components within normal limits  BASIC METABOLIC PANEL - Abnormal; Notable for the following components:   Potassium 3.2 (*)    Glucose, Bld 131 (*)    All other components within normal limits  CBC -  Abnormal; Notable for the following components:   WBC 10.7 (*)    All other components within normal limits  URINALYSIS, ROUTINE W REFLEX MICROSCOPIC - Abnormal; Notable for the following components:   APPearance HAZY (*)    Leukocytes,Ua TRACE (*)    All other components within normal limits    EKG None  Radiology DG Chest 2 View Result Date: 08/26/2023 CLINICAL DATA:  Persistent fever, cough, headache EXAM: CHEST - 2 VIEW COMPARISON:  08/16/2022 FINDINGS: The heart size and mediastinal contours are within normal limits. Both lungs are clear. The visualized skeletal structures are unremarkable. IMPRESSION: No active cardiopulmonary disease. Electronically Signed   By: Minerva Fester M.D.   On: 08/26/2023 21:45    Procedures Procedures    Medications Ordered in ED Medications - No data to display  ED Course/ Medical Decision Making/ A&P                                 Medical Decision Making Amount and/or Complexity of Data Reviewed Labs: ordered. Radiology: ordered.   This patient presents to the ED for concern of upper respiratory symptoms, this involves an extensive number of treatment options, and is a complaint that carries with it a high risk of complications and morbidity.  I considered the following differential and admission for this acute, potentially life threatening condition.  The differential diagnosis includes viral illness, pneumonia, strep  MDM:    This is a 58 year old female who presents with ongoing fever and concern for upper respiratory symptoms.  She is nontoxic-appearing and vital signs are reassuring.  She is afebrile here but did take Tylenol or ibuprofen prior to arrival.  No evidence of acute otitis media.  Lung sounds are clear and have low suspicion for pneumonia.  She did test positive for RSV.  I suspect that this is the source for her ongoing fever given that she has been on amoxicillin which would cover for both pneumonia and otitis media.   Given that she has a young grandchild at home, discussed with her that she will need to mask and avoid contact with that child until she is fever free and symptom free for at least 24 hours.  She stated understanding.  Recommend ongoing supportive measures.  (Labs, imaging, consults)  Labs: I Ordered, and personally interpreted labs.  The pertinent results include: CBC, BMP, urinalysis, COVID, influenza, RSV  Imaging Studies ordered: I ordered imaging studies including chest x-ray I independently  visualized and interpreted imaging. I agree with the radiologist interpretation  Additional history obtained from chart review.  External records from outside source obtained and reviewed including prior evaluations  Cardiac Monitoring: The patient was maintained on a cardiac monitor.  If on the cardiac monitor, I personally viewed and interpreted the cardiac monitored which showed an underlying rhythm of: Sinus  Reevaluation: After the interventions noted above, I reevaluated the patient and found that they have : Improved  Social Determinants of Health:  lives independently  Disposition: Discharge  Co morbidities that complicate the patient evaluation  Past Medical History:  Diagnosis Date   DDD (degenerative disc disease), cervical    DDD (degenerative disc disease), lumbar    Headache syndrome 04/11/2018   Hypothyroidism    Kidney stone      Medicines No orders of the defined types were placed in this encounter.   I have reviewed the patients home medicines and have made adjustments as needed  Problem List / ED Course: Problem List Items Addressed This Visit   None Visit Diagnoses       RSV infection    -  Primary                   Final Clinical Impression(s) / ED Diagnoses Final diagnoses:  RSV infection    Rx / DC Orders ED Discharge Orders     None         Shon Baton, MD 08/27/23 0006

## 2023-08-27 NOTE — ED Provider Notes (Incomplete)
[place] EMERGENCY DEPARTMENT AT [facility] Provider Note   CSN: [id number] Arrival date & time: 08/26/23  2110     History {Add pertinent medical, surgical, social history, OB history to HPI:1} Chief Complaint  Patient presents with  . Fever    Anna Orr is a 58 y.o. female.  HPI     Home Medications Prior to Admission medications   Medication Sig Start Date End Date Taking? Authorizing Provider  amoxicillin-clavulanate (AUGMENTIN) 875-125 MG tablet Take 1 tablet by mouth every 12 (twelve) hours. 08/16/22   Peter Garter, PA  azithromycin (ZITHROMAX) 250 MG tablet Take 1 tablet (250 mg total) by mouth daily. Take first 2 tablets together, then 1 every day until finished. 08/16/22   Peter Garter, PA  nortriptyline (PAMELOR) 10 MG capsule Take 3 capsules (30 mg total) by mouth at bedtime. 05/23/22   Lomax, Amy, NP  OMEPRAZOLE PO Take 3 tablets by mouth at bedtime.     [provider]  predniSONE (DELTASONE) 10 MG tablet Take 4 tablets (40 mg total) by mouth daily. 08/16/22   Peter Garter, PA  propranolol (INDERAL) 20 MG tablet TAKE 2 TABLETS BY MOUTH TWICE A DAY 07/20/23   Lomax, Amy, NP  SYNTHROID 75 MCG tablet Take 75 mcg by mouth daily before breakfast. 01/25/19   [provider]      Allergies    Gabapentin    Review of Systems   Review of Systems  Physical Exam Updated Vital Signs BP (!) 110/58 (BP Location: Right Arm)   Pulse 83   Temp 98.3 F (36.8 C) (Oral)   Resp 17   Wt 89.4 kg   SpO2 99%   BMI 30.87 kg/m  Physical Exam  ED Results / Procedures / Treatments   Labs (all labs ordered are listed, but only abnormal results are displayed) Labs Reviewed  RESP PANEL BY RT-PCR (RSV, FLU A&B, COVID)  RVPGX2 - Abnormal; Notable for the following components:      Result Value   Resp Syncytial Virus by PCR POSITIVE (*)    All other components within normal limits  BASIC METABOLIC PANEL - Abnormal; Notable for the  following components:   Potassium 3.2 (*)    Glucose, Bld 131 (*)    All other components within normal limits  CBC - Abnormal; Notable for the following components:   WBC 10.7 (*)    All other components within normal limits  URINALYSIS, ROUTINE W REFLEX MICROSCOPIC - Abnormal; Notable for the following components:   APPearance HAZY (*)    Leukocytes,Ua TRACE (*)    All other components within normal limits    EKG None  Radiology DG Chest 2 View Result Date: 08/26/2023 CLINICAL DATA:  Persistent fever, cough, headache EXAM: CHEST - 2 VIEW COMPARISON:  08/16/2022 FINDINGS: The heart size and mediastinal contours are within normal limits. Both lungs are clear. The visualized skeletal structures are unremarkable. IMPRESSION: No active cardiopulmonary disease. Electronically Signed   By: Minerva Fester M.D.   On: 08/26/2023 21:45    Procedures Procedures  {Document cardiac monitor, telemetry assessment procedure when appropriate:1}  Medications Ordered in ED Medications - No data to display  ED Course/ Medical Decision Making/ A&P   {   Click here for ABCD2, HEART and other calculatorsREFRESH Note before signing :1}  Medical Decision Making Amount and/or Complexity of Data Reviewed Labs: ordered. Radiology: ordered.   ***  {Document critical care time when appropriate:1} {Document review of labs and clinical decision tools ie heart score, Chads2Vasc2 etc:1}  {Document your independent review of radiology images, and any outside records:1} {Document your discussion with family members, caretakers, and with consultants:1} {Document social determinants of health affecting pt's care:1} {Document your decision making why or why not admission, treatments were needed:1} Final Clinical Impression(s) / ED Diagnoses Final diagnoses:  RSV infection    Rx / DC Orders ED Discharge Orders     None

## 2023-08-27 NOTE — Discharge Instructions (Signed)
You were seen today for ongoing concerns for upper respiratory symptoms and fever.  You did test positive for RSV.  This is usually a self-limited illness in adults.  However given that you have a young grandchild at home, you need to avoid contact until you are feeling well and 24 hours fever free.  Otherwise continue Tylenol and ibuprofen.  You may add Flonase or nasal saline for any ongoing congestion.

## 2023-08-30 NOTE — Progress Notes (Deleted)
 PATIENT: Anna Orr DOB: Dec 31, 1965  REASON FOR VISIT: follow up HISTORY FROM: patient  No chief complaint on file.    HISTORY OF PRESENT ILLNESS:  08/30/23 ALL:  Anna Orr returns for follow up for headaches. She was last seen 05/2022 and doing well on nortriptyline and propranolol.   05/23/2022 ALL:  Anna Orr returns for follow up for headaches. She continues nortriptyline 30mg  QHS and propranolol 40mg  BID. She reports headaches are fairly stable. She feels they have worsened over the past week but she feels this is directly related to increased stress. She is working with a Veterinary surgeon. She reports headaches occur 4-15 times a month. Usually easily aborted with ibuprofen and caffeine. She is hesitant to add or change medications at this time. She has been working on a healthy diet and regular exercise.   09/13/2021 ALL: Anna Orr returns for follow up for headaches. She continues nortriptyline 30mg  QHS and propranolol 40mg  BID. She reports that headaches have been fairly well managed until recently. She is going through a stressful time and reports more grieving. He husband is with her today and states they experienced an event years ago that has resulted in more stress in the home. This is when headaches first presented. She was doing better for a period of time while going to church. Recently, this event is causing more stress for her and correlates with worsening headaches. She was seeing a counselor but did not feel she meshed well with them. She has called a new counselor and waiting to get an appt. She describes tension style headaches. No migrainous features. She admits that she does not drink much water. She continues to follow up with PCP regularly. She would like to stop nortriptyline and propranolol in the future if headaches improve.   09/10/2020 ALL: She continues to do well. She continues nortriptyline 30mg  at bedtime and propranolol 40mg  BID. Tension style pressure all over her  head. She feels that she can't hear as well but no photo or phonophobia. No nausea or vomiting. She usually takes ibuprofen which helps. She knows that she doesn't drink as much water as she should. She continues to follow up closely with PCP. Now on Synthroid daily.   04/03/2019 ALL: Anna Orr is a 58 y.o. female here today for follow up for headaches. She continues nortriptyline 30mg  at bedtime. She is also taking propranolol 40mg  twice daily. She reports doing pretty good. She feels that she may have 2-3 headaches a month. Weather and stress are known triggers. They are not as severe as they used to be. She is able to work and do all ADL's. She tries to exercise 3-4 times a week.   HISTORY: (copied from Dr Anne Hahn' note on 04/12/2019)  Ms. Lundwall is a 58 year old right-handed white female with a history of an unusual headache syndrome.  The patient has headaches that may occur with exercise, she is able to do some forms of exercise such as walking on the treadmill, doing hand weights, but if she does the elliptical or does more strenuous weights then she will get a headache.  Heat exposure, and weather changes are also activators.  Occasionally, stress will bring on headache.  The headaches may be associated with neck and shoulder pressure going into the head, and she has pressure into the buttocks area.  The patient has some pulsation type sensations with the hearing, but light does not bother her.  She drinks 2 to 3 cups of coffee daily, she  does not drink a lot of tea or other caffeinated products.  The patient may go several weeks without a headache if she avoids the activators.  The patient has gained benefit with the nortriptyline, she no longer is missing work due to headache.  She has had fewer headaches and she has been able to get back to some forms of exercise while using the medication.  She is on a 30 mg dose at night.  She comes to this office for an evaluation   REVIEW OF SYSTEMS:  Out of a complete 14 system review of symptoms, the patient complains only of the following symptoms, stress, grief, and all other reviewed systems are negative.   ALLERGIES: Allergies  Allergen Reactions   Gabapentin     Had SE, unable to tolerate    HOME MEDICATIONS: Outpatient Medications Prior to Visit  Medication Sig Dispense Refill   amoxicillin-clavulanate (AUGMENTIN) 875-125 MG tablet Take 1 tablet by mouth every 12 (twelve) hours. 10 tablet 0   azithromycin (ZITHROMAX) 250 MG tablet Take 1 tablet (250 mg total) by mouth daily. Take first 2 tablets together, then 1 every day until finished. 6 tablet 0   nortriptyline (PAMELOR) 10 MG capsule Take 3 capsules (30 mg total) by mouth at bedtime. 270 capsule 3   OMEPRAZOLE PO Take 3 tablets by mouth at bedtime.      predniSONE (DELTASONE) 10 MG tablet Take 4 tablets (40 mg total) by mouth daily. 20 tablet 0   propranolol (INDERAL) 20 MG tablet TAKE 2 TABLETS BY MOUTH TWICE A DAY 360 tablet 0   SYNTHROID 75 MCG tablet Take 75 mcg by mouth daily before breakfast.     No facility-administered medications prior to visit.    PAST MEDICAL HISTORY: Past Medical History:  Diagnosis Date   DDD (degenerative disc disease), cervical    DDD (degenerative disc disease), lumbar    Headache syndrome 04/11/2018   Hypothyroidism    Kidney stone     PAST SURGICAL HISTORY: Past Surgical History:  Procedure Laterality Date   BLOOD PATCH     states cysts on spine were leaking and treated with blood patch   BREAST ENHANCEMENT SURGERY     COLONOSCOPY N/A 11/07/2014   Procedure: COLONOSCOPY;  Surgeon: West Bali, MD;  Location: [facility];  Service: Endoscopy;  Laterality: N/A;  0830    NECK SURGERY     2017   TUBAL LIGATION      FAMILY HISTORY: Family History  Problem Relation Age of Onset   Eczema Sister    Colon cancer Neg Hx    Allergic rhinitis Neg Hx    Asthma Neg Hx    Urticaria Neg Hx     SOCIAL HISTORY: Social History    Socioeconomic History   Marital status: Married    Spouse name: Not on file   Number of children: Not on file   Years of education: Not on file   Highest education level: Not on file  Occupational History   Occupation: printing    Comment: lifting, pushing  Tobacco Use   Smoking status: Never   Smokeless tobacco: Never  Substance and Sexual Activity   Alcohol use: No    Alcohol/week: 0.0 standard drinks of alcohol    Comment: 1 mixed drink a day    Drug use: No   Sexual activity: Not on file  Other Topics Concern   Not on file  Social History Narrative   ** Merged History Encounter **  Social Drivers of Corporate investment banker Strain: Not on file  Food Insecurity: Not on file  Transportation Needs: Not on file  Physical Activity: Inactive (09/03/2020)   [facility], [facility]   Exercise Vital Sign    Days of Exercise per Week: 0 days    Minutes of Exercise per Session: 0 min  Stress: Not on file  Social Connections: Not on file  Intimate Partner Violence: Not At Risk (04/19/2022)   Received from [facility], [facility]   Humiliation, Afraid, Rape, and Kick questionnaire    Fear of Current or Ex-Partner: No    Emotionally Abused: No    Physically Abused: No    Sexually Abused: No     PHYSICAL EXAM  There were no vitals filed for this visit.    There is no height or weight on file to calculate BMI.  Generalized: Well developed, in no acute distress  Cardiology: normal rate and rhythm, no murmur noted Neurological examination  Mentation: Alert oriented to time, place, history taking. Follows all commands speech and language fluent Cranial nerve II-XII: Pupils were equal round reactive to light. Extraocular movements were full, visual field were full on confrontational test. Facial sensation and strength were normal. Head turning and shoulder shrug  were normal and symmetric. Motor: The motor testing reveals 5 over 5  strength of all 4 extremities. Good symmetric motor tone is noted throughout.  Gait and station: Gait is normal.   DIAGNOSTIC DATA (LABS, IMAGING, TESTING) - I reviewed patient records, labs, notes, testing and imaging myself where available.      No data to display           Lab Results  Component Value Date   WBC 10.7 (H) 08/26/2023   HGB 14.6 08/26/2023   HCT 44.8 08/26/2023   MCV 89.6 08/26/2023   PLT 361 08/26/2023      Component Value Date/Time   NA 139 08/26/2023 2154   K 3.2 (L) 08/26/2023 2154   CL 109 08/26/2023 2154   CO2 24 08/26/2023 2154   GLUCOSE 131 (H) 08/26/2023 2154   BUN 19 08/26/2023 2154   CREATININE 0.99 08/26/2023 2154   CALCIUM 9.0 08/26/2023 2154   PROT 6.8 05/24/2015 1905   ALBUMIN 3.9 05/24/2015 1905   AST 13 (L) 05/24/2015 1905   ALT 12 (L) 05/24/2015 1905   ALKPHOS 66 05/24/2015 1905   BILITOT 0.6 05/24/2015 1905   GFRNONAA >60 08/26/2023 2154   GFRAA >60 03/14/2016 1420   No results found for: "CHOL", "HDL", "LDLCALC", "LDLDIRECT", "TRIG", "CHOLHDL" No results found for: "HGBA1C" No results found for: "VITAMINB12" Lab Results  Component Value Date   TSH 4.460 04/11/2018       ASSESSMENT AND PLAN 58 y.o. year old female  has a past medical history of DDD (degenerative disc disease), cervical, DDD (degenerative disc disease), lumbar, Headache syndrome (04/11/2018), Hypothyroidism, and Kidney stone. here with   No diagnosis found.    She reports that headaches were previously well managed but seem to be worse with increased stressors. She is tolerating propranolol and nortriptyline without obvious adverse effects.  We will continue propranolol 40 mg twice daily as well as nortriptyline 30 mg at night. I have encouraged her to continuing reaching out to her counselor. She was encouraged to call me if headaches worsen. I have encouraged her to increase water intake and avoid known triggers. She will continue OTC analgesics for abortive  therapy but advised against regular use. I will plan to have her followed by Dr Delena Bali in [facility] retirement. She will follow-up with me in 1 year.  She verbalizes an agreement with this plan.   No orders of the defined types were placed in this encounter.    No orders of the defined types were placed in this encounter.     Shawnie Dapper, FNP-C 08/30/2023, 3:47 PM [facility] [address] [place], [place] [address] [phone]

## 2023-08-30 NOTE — Patient Instructions (Incomplete)

## 2023-08-31 ENCOUNTER — Ambulatory Visit: Payer: BC Managed Care – PPO | Admitting: Family Medicine

## 2023-08-31 ENCOUNTER — Encounter: Payer: Self-pay | Admitting: Family Medicine

## 2023-08-31 DIAGNOSIS — G4489 Other headache syndrome: Secondary | ICD-10-CM

## 2023-09-01 ENCOUNTER — Telehealth: Payer: Self-pay

## 2023-09-01 DIAGNOSIS — G4489 Other headache syndrome: Secondary | ICD-10-CM

## 2023-09-01 MED ORDER — NORTRIPTYLINE HCL 10 MG PO CAPS: 30.0000 mg | ORAL_CAPSULE | Freq: Every day | ORAL | 0 refills | Status: DC

## 2023-09-01 NOTE — Telephone Encounter (Signed)
1 week fill of nortriptyline sent to pharmacy.

## 2023-09-01 NOTE — Telephone Encounter (Signed)
This patient came in today for her yearly appointment , but it was actually on Thursday 1/23. I was able to get her r/s for Monday 1/27 at 1:30. She advised that she has been completely out of her  nortriptyline (PAMELOR) 10 MG capsule for about a week. She is asking if a refill can be sent in to [id number] - [place], [place] - [address] AT [facility]

## 2023-09-04 ENCOUNTER — Ambulatory Visit: Payer: BC Managed Care – PPO | Admitting: Family Medicine

## 2023-09-04 ENCOUNTER — Telehealth: Payer: Self-pay | Admitting: Family Medicine

## 2023-09-04 ENCOUNTER — Other Ambulatory Visit: Payer: Self-pay

## 2023-09-04 DIAGNOSIS — G4489 Other headache syndrome: Secondary | ICD-10-CM

## 2023-09-04 MED ORDER — NORTRIPTYLINE HCL 10 MG PO CAPS: 30.0000 mg | ORAL_CAPSULE | Freq: Every day | ORAL | 2 refills | Status: DC

## 2023-09-04 NOTE — Telephone Encounter (Signed)
Last seen 05/23/22 and next f/u 11/07/23.   Propranolol refill sent 07/20/23 #360 (90days supply). Too soon to send in refill for this. Nortriptyline due for refill. Sent 09/01/23 #12 (7days supply). I e-scribed refill for this.

## 2023-09-04 NOTE — Telephone Encounter (Signed)
Pt states since appt today with Amy Lomax NP was canceled she will be needing refill of  nortriptyline (PAMELOR) 10 MG capsule and propranolol (INDERAL) 20 MG table before next appt 11/07/2023.

## 2023-09-30 ENCOUNTER — Other Ambulatory Visit: Payer: Self-pay | Admitting: Family Medicine

## 2023-09-30 DIAGNOSIS — G4489 Other headache syndrome: Secondary | ICD-10-CM

## 2023-10-24 ENCOUNTER — Other Ambulatory Visit: Payer: Self-pay | Admitting: *Deleted

## 2023-10-24 DIAGNOSIS — G4489 Other headache syndrome: Secondary | ICD-10-CM

## 2023-10-24 MED ORDER — PROPRANOLOL HCL 20 MG PO TABS: ORAL_TABLET | ORAL | 0 refills | Status: DC

## 2023-11-06 NOTE — Progress Notes (Unsigned)
 PATIENT: Anna Orr DOB: 08-14-1965  REASON FOR VISIT: follow up HISTORY FROM: patient  No chief complaint on file.    HISTORY OF PRESENT ILLNESS:  11/06/23 ALL:  Anna Orr returns for follow up for headaches. Anna Orr was last seen 05/2022 and doing well on nortriptyline 30mg  QHS and propranolol 40mg  BID. Since,   05/23/2022 ALL:  Anna Orr returns for follow up for headaches. Anna Orr continues nortriptyline 30mg  QHS and propranolol 40mg  BID. Anna Orr reports headaches are fairly stable. Anna Orr feels they have worsened over the past week but Anna Orr feels this is directly related to increased stress. Anna Orr is working with a Veterinary surgeon. Anna Orr reports headaches occur 4-15 times a month. Usually easily aborted with ibuprofen and caffeine. Anna Orr is hesitant to add or change medications at this time. Anna Orr has been working on a healthy diet and regular exercise.   09/13/2021 ALL: Anna Orr returns for follow up for headaches. Anna Orr continues nortriptyline 30mg  QHS and propranolol 40mg  BID. Anna Orr reports that headaches have been fairly well managed until recently. Anna Orr is going through a stressful time and reports more grieving. He husband is with her today and states they experienced an event years ago that has resulted in more stress in the home. This is when headaches first presented. Anna Orr was doing better for a period of time while going to church. Recently, this event is causing more stress for her and correlates with worsening headaches. Anna Orr was seeing a counselor but did not feel Anna Orr meshed well with them. Anna Orr has called a new counselor and waiting to get an appt. Anna Orr describes tension style headaches. No migrainous features. Anna Orr admits that Anna Orr does not drink much water. Anna Orr continues to follow up with PCP regularly. Anna Orr would like to stop nortriptyline and propranolol in the future if headaches improve.   09/10/2020 ALL: Anna Orr continues to do well. Anna Orr continues nortriptyline 30mg  at bedtime and propranolol 40mg  BID. Tension style  pressure all over her head. Anna Orr feels that Anna Orr can't hear as well but no photo or phonophobia. No nausea or vomiting. Anna Orr usually takes ibuprofen which helps. Anna Orr knows that Anna Orr doesn't drink as much water as Anna Orr should. Anna Orr continues to follow up closely with PCP. Now on Synthroid daily.   04/03/2019 ALL: Anna Orr is a 58 y.o. female here today for follow up for headaches. Anna Orr continues nortriptyline 30mg  at bedtime. Anna Orr is also taking propranolol 40mg  twice daily. Anna Orr reports doing pretty good. Anna Orr feels that Anna Orr may have 2-3 headaches a month. Weather and stress are known triggers. They are not as severe as they used to be. Anna Orr is able to work and do all ADL's. Anna Orr tries to exercise 3-4 times a week.   HISTORY: (copied from Dr Anne Hahn' note on 04/12/2019)  Anna Orr is a 58 year old right-handed white female with a history of an unusual headache syndrome.  The patient has headaches that may occur with exercise, Anna Orr is able to do some forms of exercise such as walking on the treadmill, doing hand weights, but if Anna Orr does the elliptical or does more strenuous weights then Anna Orr will get a headache.  Heat exposure, and weather changes are also activators.  Occasionally, stress will bring on headache.  The headaches may be associated with neck and shoulder pressure going into the head, and Anna Orr has pressure into the buttocks area.  The patient has some pulsation type sensations with the hearing, but light does not bother her.  Anna Orr drinks 2 to 3  cups of coffee daily, Anna Orr does not drink a lot of tea or other caffeinated products.  The patient may go several weeks without a headache if Anna Orr avoids the activators.  The patient has gained benefit with the nortriptyline, Anna Orr no longer is missing work due to headache.  Anna Orr has had fewer headaches and Anna Orr has been able to get back to some forms of exercise while using the medication.  Anna Orr is on a 30 mg dose at night.  Anna Orr comes to this office for an  evaluation   REVIEW OF SYSTEMS: Out of a complete 14 system review of symptoms, the patient complains only of the following symptoms, stress, grief, and all other reviewed systems are negative.   ALLERGIES: Allergies  Allergen Reactions   Gabapentin     Had SE, unable to tolerate    HOME MEDICATIONS: Outpatient Medications Prior to Visit  Medication Sig Dispense Refill   amoxicillin-clavulanate (AUGMENTIN) 875-125 MG tablet Take 1 tablet by mouth every 12 (twelve) hours. 10 tablet 0   azithromycin (ZITHROMAX) 250 MG tablet Take 1 tablet (250 mg total) by mouth daily. Take first 2 tablets together, then 1 every day until finished. 6 tablet 0   nortriptyline (PAMELOR) 10 MG capsule TAKE 3 CAPSULES (30 MG TOTAL) BY MOUTH AT BEDTIME. MUST KEEP FOLLOW UP 11/07/23 FOR ONGOING REFILLS. 270 capsule 1   OMEPRAZOLE PO Take 3 tablets by mouth at bedtime.      predniSONE (DELTASONE) 10 MG tablet Take 4 tablets (40 mg total) by mouth daily. 20 tablet 0   propranolol (INDERAL) 20 MG tablet TAKE 2 TABLETS BY MOUTH TWICE A DAY 120 tablet 0   SYNTHROID 75 MCG tablet Take 75 mcg by mouth daily before breakfast.     No facility-administered medications prior to visit.    PAST MEDICAL HISTORY: Past Medical History:  Diagnosis Date   DDD (degenerative disc disease), cervical    DDD (degenerative disc disease), lumbar    Headache syndrome 04/11/2018   Hypothyroidism    Kidney stone     PAST SURGICAL HISTORY: Past Surgical History:  Procedure Laterality Date   BLOOD PATCH     states cysts on spine were leaking and treated with blood patch   BREAST ENHANCEMENT SURGERY     COLONOSCOPY N/A 11/07/2014   Procedure: COLONOSCOPY;  Surgeon: West Bali, MD;  Location: [facility];  Service: Endoscopy;  Laterality: N/A;  0830    NECK SURGERY     2017   TUBAL LIGATION      FAMILY HISTORY: Family History  Problem Relation Age of Onset   Eczema Sister    Colon cancer Neg Hx    Allergic rhinitis  Neg Hx    Asthma Neg Hx    Urticaria Neg Hx     SOCIAL HISTORY: Social History   Socioeconomic History   Marital status: Married    Spouse name: Not on file   Number of children: Not on file   Years of education: Not on file   Highest education level: Not on file  Occupational History   Occupation: printing    Comment: lifting, pushing  Tobacco Use   Smoking status: Never   Smokeless tobacco: Never  Substance and Sexual Activity   Alcohol use: No    Alcohol/week: 0.0 standard drinks of alcohol    Comment: 1 mixed drink a day    Drug use: No   Sexual activity: Not on file  Other Topics Concern  Not on file  Social History Narrative   ** Merged History Encounter **       Social Drivers of Health   Financial Resource Strain: Not on file  Food Insecurity: Not on file  Transportation Needs: Not on file  Physical Activity: Inactive (09/03/2020)   Received from [facility], [facility]   Exercise Vital Sign    Days of Exercise per Week: 0 days    Minutes of Exercise per Session: 0 min  Stress: Not on file  Social Connections: Not on file  Intimate Partner Violence: Not At Risk (04/19/2022)   Received from [facility], [facility]   Humiliation, Afraid, Rape, and Kick questionnaire    Fear of Current or Ex-Partner: No    Emotionally Abused: No    Physically Abused: No    Sexually Abused: No     PHYSICAL EXAM  There were no vitals filed for this visit.    There is no height or weight on file to calculate BMI.  Generalized: Well developed, in no acute distress  Cardiology: normal rate and rhythm, no murmur noted Neurological examination  Mentation: Alert oriented to time, place, history taking. Follows all commands speech and language fluent Cranial nerve II-XII: Pupils were equal round reactive to light. Extraocular movements were full, visual field were full on confrontational test. Facial sensation and strength were normal. Head turning and  shoulder shrug  were normal and symmetric. Motor: The motor testing reveals 5 over 5 strength of all 4 extremities. Good symmetric motor tone is noted throughout.  Gait and station: Gait is normal.   DIAGNOSTIC DATA (LABS, IMAGING, TESTING) - I reviewed patient records, labs, notes, testing and imaging myself where available.      No data to display           Lab Results  Component Value Date   WBC 10.7 (H) 08/26/2023   HGB 14.6 08/26/2023   HCT 44.8 08/26/2023   MCV 89.6 08/26/2023   PLT 361 08/26/2023      Component Value Date/Time   NA 139 08/26/2023 2154   K 3.2 (L) 08/26/2023 2154   CL 109 08/26/2023 2154   CO2 24 08/26/2023 2154   GLUCOSE 131 (H) 08/26/2023 2154   BUN 19 08/26/2023 2154   CREATININE 0.99 08/26/2023 2154   CALCIUM 9.0 08/26/2023 2154   PROT 6.8 05/24/2015 1905   ALBUMIN 3.9 05/24/2015 1905   AST 13 (L) 05/24/2015 1905   ALT 12 (L) 05/24/2015 1905   ALKPHOS 66 05/24/2015 1905   BILITOT 0.6 05/24/2015 1905   GFRNONAA >60 08/26/2023 2154   GFRAA >60 03/14/2016 1420   No results found for: "CHOL", "HDL", "LDLCALC", "LDLDIRECT", "TRIG", "CHOLHDL" No results found for: "HGBA1C" No results found for: "VITAMINB12" Lab Results  Component Value Date   TSH 4.460 04/11/2018       ASSESSMENT AND PLAN 58 y.o. year old female  has a past medical history of DDD (degenerative disc disease), cervical, DDD (degenerative disc disease), lumbar, Headache syndrome (04/11/2018), Hypothyroidism, and Kidney stone. here with   No diagnosis found.    Anna Orr reports that headaches were previously well managed but seem to be worse with increased stressors. Anna Orr is tolerating propranolol and nortriptyline without obvious adverse effects.  We will continue propranolol 40 mg twice daily as well as nortriptyline 30 mg at night. I have encouraged her to continuing reaching out to her counselor. Anna Orr was encouraged to call me if headaches worsen.  I have encouraged her to increase  water intake and avoid known triggers. Anna Orr will continue OTC analgesics for abortive therapy but advised against regular use. I will plan to have her followed by Dr Delena Bali in [place] retirement. Anna Orr will follow-up with me in 1 year.  Anna Orr verbalizes an agreement with this plan.   No orders of the defined types were placed in this encounter.    No orders of the defined types were placed in this encounter.     Shawnie Dapper, FNP-C 11/06/2023, 6:50 PM [facility] [address], [address] [place], [place] [address] [phone]

## 2023-11-06 NOTE — Patient Instructions (Incomplete)
 Below is our plan:  We will continue nortriptyline and propranolol as prescribed. Continue OTC analgesics for abortive therapy   Make sure your care tam knows about your history of migraines. It does not sound like you have aura symptoms but let me know if anything changes.   Please make sure you are staying well hydrated. I recommend 50-60 ounces daily. Well balanced diet and regular exercise encouraged. Consistent sleep schedule with 6-8 hours recommended.   Please continue follow up with care team as directed.   Follow up with me as needed if PCP willing to refill meds, otherwise we will see you annually.   You may receive a survey regarding today's visit. I encourage you to leave honest feed back as I do use this information to improve patient care. Thank you for seeing me today!   GENERAL HEADACHE INFORMATION:   Natural supplements: Magnesium Oxide or Magnesium Glycinate 500 mg at bed (up to 800 mg daily) Coenzyme Q10 300 mg in AM Vitamin B2- 200 mg twice a day   Add 1 supplement at a time since even natural supplements can have undesirable side effects. You can sometimes buy supplements cheaper (especially Coenzyme Q10) at www.WebmailGuide.co.za or at [facility].  Migraine with aura: There is increased risk for stroke in women with migraine with aura and a contraindication for the combined contraceptive pill for use by women who have migraine with aura. The risk for women with migraine without aura is lower. However other risk factors like smoking are far more likely to increase stroke risk than migraine. There is a recommendation for no smoking and for the use of OCPs without estrogen such as progestogen only pills particularly for women with migraine with aura.Marland Kitchen People who have migraine headaches with auras may be 3 times more likely to have a stroke caused by a blood clot, compared to migraine patients who don't see auras. Women who take hormone-replacement therapy may be 30 percent more likely  to suffer a clot-based stroke than women not taking medication containing estrogen. Other risk factors like smoking and high blood pressure may be  much more important.    Vitamins and herbs that show potential:   Magnesium: Magnesium (250 mg twice a day or 500 mg at bed) has a relaxant effect on smooth muscles such as blood vessels. Individuals suffering from frequent or daily headache usually have low magnesium levels which can be increase with daily supplementation of 400-750 mg. Three trials found 40-90% average headache reduction  when used as a preventative. Magnesium may help with headaches are aura, the best evidence for magnesium is for migraine with aura is its thought to stop the cortical spreading depression we believe is the pathophysiology of migraine aura.Magnesium also demonstrated the benefit in menstrually related migraine.  Magnesium is part of the messenger system in the serotonin cascade and it is a good muscle relaxant.  It is also useful for constipation which can be a side effect of other medications used to treat migraine. Good sources include nuts, whole grains, and tomatoes. Side Effects: loose stool/diarrhea  Riboflavin (vitamin B 2) 200 mg twice a day. This vitamin assists nerve cells in the production of ATP a principal energy storing molecule.  It is necessary for many chemical reactions in the body.  There have been at least 3 clinical trials of riboflavin using 400 mg per day all of which suggested that migraine frequency can be decreased.  All 3 trials showed significant improvement in over half of migraine  sufferers.  The supplement is found in bread, cereal, milk, meat, and poultry.  Most Americans get more riboflavin than the recommended daily allowance, however riboflavin deficiency is not necessary for the supplements to help prevent headache. Side effects: energizing, green urine   Coenzyme Q10: This is present in almost all cells in the body and is critical component  for the conversion of energy.  Recent studies have shown that a nutritional supplement of CoQ10 can reduce the frequency of migraine attacks by improving the energy production of cells as with riboflavin.  Doses of 150 mg twice a day have been shown to be effective.   Melatonin: Increasing evidence shows correlation between melatonin secretion and headache conditions.  Melatonin supplementation has decreased headache intensity and duration.  It is widely used as a sleep aid.  Sleep is natures way of dealing with migraine.  A dose of 3 mg is recommended to start for headaches including cluster headache. Higher doses up to 15 mg has been reviewed for use in Cluster headache and have been used. The rationale behind using melatonin for cluster is that many theories regarding the cause of Cluster headache center around the disruption of the normal circadian rhythm in the brain.  This helps restore the normal circadian rhythm.   HEADACHE DIET: Foods and beverages which may trigger migraine Note that only 20% of headache patients are food sensitive. You will know if you are food sensitive if you get a headache consistently 20 minutes to 2 hours after eating a certain food. Only cut out a food if it causes headaches, otherwise you might remove foods you enjoy! What matters most for diet is to eat a well balanced healthy diet full of vegetables and low fat protein, and to not miss meals.   Chocolate, other sweets ALL cheeses except cottage and cream cheese Dairy products, yogurt, sour cream, ice cream Liver Meat extracts (Bovril, Marmite, meat tenderizers) Meats or fish which have undergone aging, fermenting, pickling or smoking. These include: Hotdogs,salami,Lox,sausage, mortadellas,smoked salmon, pepperoni, Pickled herring Pods of broad bean (English beans, Chinese pea pods, [place] (fava) beans, lima and navy beans Ripe avocado, ripe banana Yeast extracts or active yeast preparations such as Brewer's or  Fleishman's (commercial bakes goods are permitted) Tomato based foods, pizza (lasagna, etc.)   MSG (monosodium glutamate) is disguised as many things; look for these common aliases: Monopotassium glutamate Autolysed yeast Hydrolysed protein Sodium caseinate "flavorings" "all natural preservatives" Nutrasweet   Avoid all other foods that convincingly provoke headaches.   Resources: The Dizzy [place]air Laundry Your Headache Diet, migrainestrong.com  https://zamora-andrews.com/   Caffeine and Migraine For patients that have migraine, caffeine intake more than 3 days per week can lead to dependency and increased migraine frequency. I would recommend cutting back on your caffeine intake as best you can. The recommended amount of caffeine is 200-300 mg daily, although migraine patients may experience dependency at even lower doses. While you may notice an increase in headache temporarily, cutting back will be helpful for headaches in the long run. For more information on caffeine and migraine, visit: https://americanmigrainefoundation.org/resource-library/caffeine-and-migraine/   Headache Prevention Strategies:   1. Maintain a headache diary; learn to identify and avoid triggers.  - This can be a simple note where you log when you had a headache, associated symptoms, and medications used - There are several smartphone apps developed to help track migraines: Migraine Buddy, Migraine Monitor, Curelator N1-Headache App   Common triggers include: Emotional triggers: Emotional/Upset family or friends Emotional/Upset occupation  Business reversal/success Anticipation anxiety Crisis-serious Post-crisis periodNew job/position   Physical triggers: Vacation Day Weekend Strenuous Exercise High Altitude Location New Move Menstrual Day Physical Illness Oversleep/Not enough sleep Weather changes Light: Photophobia or light sesnitivity treatment involves  a balance between desensitization and reduction in overly strong input. Use dark polarized glasses outside, but not inside. Avoid bright or fluorescent light, but do not dim environment to the point that going into a normally lit room hurts. Consider FL-41 tint lenses, which reduce the most irritating wavelengths without blocking too much light.  These can be obtained at axonoptics.com or theraspecs.com Foods: see list above.   2. Limit use of acute treatments (over-the-counter medications, triptans, etc.) to no more than 2 days per week or 10 days per month to prevent medication overuse headache (rebound headache).     3. Follow a regular schedule (including weekends and holidays): Don't skip meals. Eat a balanced diet. 8 hours of sleep nightly. Minimize stress. Exercise 30 minutes per day. Being overweight is associated with a 5 times increased risk of chronic migraine. Keep well hydrated and drink 6-8 glasses of water per day.   4. Initiate non-pharmacologic measures at the earliest onset of your headache. Rest and quiet environment. Relax and reduce stress. Breathe2Relax is a free app that can instruct you on    some simple relaxtion and breathing techniques. Http://Dawnbuse.com is a    free website that provides teaching videos on relaxation.  Also, there are  many apps that   can be downloaded for "mindful" relaxation.  An app called YOGA NIDRA will help walk you through mindfulness. Another app called Calm can be downloaded to give you a structured mindfulness guide with daily reminders and skill development. Headspace for guided meditation Mindfulness Based Stress Reduction Online Course: www.palousemindfulness.com Cold compresses.   5. Don't wait!! Take the maximum allowable dosage of prescribed medication at the first sign of migraine.   6. Compliance:  Take prescribed medication regularly as directed and at the first sign of a migraine.   7. Communicate:  Call your physician when  problems arise, especially if your headaches change, increase in frequency/severity, or become associated with neurological symptoms (weakness, numbness, slurred speech, etc.). Proceed to emergency room if you experience new or worsening symptoms or symptoms do not resolve, if you have new neurologic symptoms or if headache is severe, or for any concerning symptom.   8. Headache/pain management therapies: Consider various complementary methods, including medication, behavioral therapy, psychological counselling, biofeedback, massage therapy, acupuncture, dry needling, and other modalities.  Such measures may reduce the need for medications. Counseling for pain management, where patients learn to function and ignore/minimize their pain, seems to work very well.   9. Recommend changing family's attention and focus away from patient's headaches. Instead, emphasize daily activities. If first question of day is 'How are your headaches/Do you have a headache today?', then patient will constantly think about headaches, thus making them worse. Goal is to re-direct attention away from headaches, toward daily activities and other distractions.   10. Helpful Websites: www.AmericanHeadacheSociety.org PatentHood.ch www.headaches.org TightMarket.nl www.achenet.org

## 2023-11-07 ENCOUNTER — Encounter: Payer: Self-pay | Admitting: Family Medicine

## 2023-11-07 ENCOUNTER — Ambulatory Visit (INDEPENDENT_AMBULATORY_CARE_PROVIDER_SITE_OTHER): Payer: BC Managed Care – PPO | Admitting: Family Medicine

## 2023-11-07 VITALS — BP 113/66 | HR 81 | Ht 67.0 in | Wt 211.0 lb

## 2023-11-07 DIAGNOSIS — G4489 Other headache syndrome: Secondary | ICD-10-CM

## 2023-11-07 MED ORDER — NORTRIPTYLINE HCL 10 MG PO CAPS: 30.0000 mg | ORAL_CAPSULE | Freq: Every day | ORAL | 3 refills | Status: AC

## 2023-11-07 MED ORDER — PROPRANOLOL HCL 20 MG PO TABS: 40.0000 mg | ORAL_TABLET | Freq: Two times a day (BID) | ORAL | 3 refills | Status: AC

## 2024-07-12 DIAGNOSIS — M9903 Segmental and somatic dysfunction of lumbar region: Secondary | ICD-10-CM | POA: Diagnosis not present

## 2024-07-12 DIAGNOSIS — M9902 Segmental and somatic dysfunction of thoracic region: Secondary | ICD-10-CM | POA: Diagnosis not present

## 2024-07-12 DIAGNOSIS — M62830 Muscle spasm of back: Secondary | ICD-10-CM | POA: Diagnosis not present

## 2024-07-12 DIAGNOSIS — M9905 Segmental and somatic dysfunction of pelvic region: Secondary | ICD-10-CM | POA: Diagnosis not present

## 2024-07-19 DIAGNOSIS — M9903 Segmental and somatic dysfunction of lumbar region: Secondary | ICD-10-CM | POA: Diagnosis not present

## 2024-07-19 DIAGNOSIS — M9905 Segmental and somatic dysfunction of pelvic region: Secondary | ICD-10-CM | POA: Diagnosis not present

## 2024-07-19 DIAGNOSIS — M62830 Muscle spasm of back: Secondary | ICD-10-CM | POA: Diagnosis not present

## 2024-07-19 DIAGNOSIS — M9902 Segmental and somatic dysfunction of thoracic region: Secondary | ICD-10-CM | POA: Diagnosis not present

## 2024-07-24 DIAGNOSIS — M9903 Segmental and somatic dysfunction of lumbar region: Secondary | ICD-10-CM | POA: Diagnosis not present

## 2024-07-24 DIAGNOSIS — M9905 Segmental and somatic dysfunction of pelvic region: Secondary | ICD-10-CM | POA: Diagnosis not present

## 2024-07-24 DIAGNOSIS — M62830 Muscle spasm of back: Secondary | ICD-10-CM | POA: Diagnosis not present

## 2024-07-24 DIAGNOSIS — M9902 Segmental and somatic dysfunction of thoracic region: Secondary | ICD-10-CM | POA: Diagnosis not present

## 2024-08-05 DIAGNOSIS — M9903 Segmental and somatic dysfunction of lumbar region: Secondary | ICD-10-CM | POA: Diagnosis not present

## 2024-08-05 DIAGNOSIS — M62830 Muscle spasm of back: Secondary | ICD-10-CM | POA: Diagnosis not present

## 2024-08-05 DIAGNOSIS — M9905 Segmental and somatic dysfunction of pelvic region: Secondary | ICD-10-CM | POA: Diagnosis not present

## 2024-08-05 DIAGNOSIS — M9902 Segmental and somatic dysfunction of thoracic region: Secondary | ICD-10-CM | POA: Diagnosis not present
